# Patient Record
Sex: Female | Born: 1990 | Race: White | Hispanic: No | Marital: Married | Smoking: Never smoker
Health system: Southern US, Community
[De-identification: ages and names within clinical notes are randomized; demographics above are authoritative.]

## PROBLEM LIST (undated history)

## (undated) DIAGNOSIS — F988 Other specified behavioral and emotional disorders with onset usually occurring in childhood and adolescence: Secondary | ICD-10-CM

## (undated) HISTORY — DX: Other specified behavioral and emotional disorders with onset usually occurring in childhood and adolescence: F98.8

## (undated) HISTORY — PX: NO PAST SURGERIES: SHX2092

---

## 2017-12-02 ENCOUNTER — Encounter: Payer: Self-pay | Admitting: Obstetrics and Gynecology

## 2017-12-02 ENCOUNTER — Ambulatory Visit (INDEPENDENT_AMBULATORY_CARE_PROVIDER_SITE_OTHER): Payer: 59 | Admitting: Obstetrics and Gynecology

## 2017-12-02 VITALS — BP 112/65 | HR 77 | Ht 65.5 in | Wt 133.7 lb

## 2017-12-02 DIAGNOSIS — N926 Irregular menstruation, unspecified: Secondary | ICD-10-CM | POA: Diagnosis not present

## 2017-12-02 LAB — POCT URINE PREGNANCY: Preg Test, Ur: POSITIVE — AB

## 2017-12-02 NOTE — Progress Notes (Signed)
  Subjective:     Patient ID: Holly Tate, female   DOB: November 02, 1990, 27 y.o.   MRN: [id number]  HPI Here for pregnancy confirmation with LMP 09/28/17, giving [facility] 07/05/18 & EGA [redacted]w[redacted]d.  Review of Systems     Objective:   Physical Exam A&Ox4 Well groomed female Blood pressure 112/65, pulse 77, height 5' 5.5" (1.664 m), weight 133 lb 11.2 oz (60.6 kg), last menstrual period 09/28/2017. UPT+    Assessment:     Missed menses    Plan:     Viability scan and nurse intake in 1 week, then New OB PE in 3 weeks.   Jillana Selph,CNM

## 2017-12-02 NOTE — Patient Instructions (Signed)
First Trimester of Pregnancy The first trimester of pregnancy is from week 1 until the end of week 13 (months 1 through 3). During this time, your baby will begin to develop inside you. At 6-8 weeks, the eyes and face are formed, and the heartbeat can be seen on ultrasound. At the end of 12 weeks, all the baby's organs are formed. Prenatal care is all the medical care you receive before the birth of your baby. Make sure you get good prenatal care and follow all of your doctor's instructions. Follow these instructions at home: Medicines  Take over-the-counter and prescription medicines only as told by your doctor. Some medicines are safe and some medicines are not safe during pregnancy.  Take a prenatal vitamin that contains at least 600 micrograms (mcg) of folic acid.  If you have trouble pooping (constipation), take medicine that will make your stool soft (stool softener) if your doctor approves. Eating and drinking  Eat regular, healthy meals.  Your doctor will tell you the amount of weight gain that is right for you.  Avoid raw meat and uncooked cheese.  If you feel sick to your stomach (nauseous) or throw up (vomit): ? Eat 4 or 5 small meals a day instead of 3 large meals. ? Try eating a few soda crackers. ? Drink liquids between meals instead of during meals.  To prevent constipation: ? Eat foods that are high in fiber, like fresh fruits and vegetables, whole grains, and beans. ? Drink enough fluids to keep your pee (urine) clear or pale yellow. Activity  Exercise only as told by your doctor. Stop exercising if you have cramps or pain in your lower belly (abdomen) or low back.  Do not exercise if it is too hot, too humid, or if you are in a place of great height (high altitude).  Try to avoid standing for long periods of time. Move your legs often if you must stand in one place for a long time.  Avoid heavy lifting.  Wear low-heeled shoes. Sit and stand up straight.  You  can have sex unless your doctor tells you not to. Relieving pain and discomfort  Wear a good support bra if your breasts are sore.  Take warm water baths (sitz baths) to soothe pain or discomfort caused by hemorrhoids. Use hemorrhoid cream if your doctor says it is okay.  Rest with your legs raised if you have leg cramps or low back pain.  If you have puffy, bulging veins (varicose veins) in your legs: ? Wear support hose or compression stockings as told by your doctor. ? Raise (elevate) your feet for 15 minutes, 3-4 times a day. ? Limit salt in your food. Prenatal care  Schedule your prenatal visits by the twelfth week of pregnancy.  Write down your questions. Take them to your prenatal visits.  Keep all your prenatal visits as told by your doctor. This is important. Safety  Wear your seat belt at all times when driving.  Make a list of emergency phone numbers. The list should include numbers for family, friends, the hospital, and police and fire departments. General instructions  Ask your doctor for a referral to a local prenatal class. Begin classes no later than at the start of month 6 of your pregnancy.  Ask for help if you need counseling or if you need help with nutrition. Your doctor can give you advice or tell you where to go for help.  Do not use hot tubs, steam rooms, or   saunas.  Do not douche or use tampons or scented sanitary pads.  Do not cross your legs for long periods of time.  Avoid all herbs and alcohol. Avoid drugs that are not approved by your doctor.  Do not use any tobacco products, including cigarettes, chewing tobacco, and electronic cigarettes. If you need help quitting, ask your doctor. You may get counseling or other support to help you quit.  Avoid cat litter boxes and soil used by cats. These carry germs that can cause birth defects in the baby and can cause a loss of your baby (miscarriage) or stillbirth.  Visit your dentist. At home, brush  your teeth with a soft toothbrush. Be gentle when you floss. Contact a doctor if:  You are dizzy.  You have mild cramps or pressure in your lower belly.  You have a nagging pain in your belly area.  You continue to feel sick to your stomach, you throw up, or you have watery poop (diarrhea).  You have a bad smelling fluid coming from your vagina.  You have pain when you pee (urinate).  You have increased puffiness (swelling) in your face, hands, legs, or ankles. Get help right away if:  You have a fever.  You are leaking fluid from your vagina.  You have spotting or bleeding from your vagina.  You have very bad belly cramping or pain.  You gain or lose weight rapidly.  You throw up blood. It may look like coffee grounds.  You are around people who have German measles, fifth disease, or chickenpox.  You have a very bad headache.  You have shortness of breath.  You have any kind of trauma, such as from a fall or a car accident. Summary  The first trimester of pregnancy is from week 1 until the end of week 13 (months 1 through 3).  To take care of yourself and your unborn baby, you will need to eat healthy meals, take medicines only if your doctor tells you to do so, and do activities that are safe for you and your baby.  Keep all follow-up visits as told by your doctor. This is important as your doctor will have to ensure that your baby is healthy and growing well. This information is not intended to replace advice given to you by your health care provider. Make sure you discuss any questions you have with your health care provider. Document Released: 07/28/2007 Document Revised: 02/17/2016 Document Reviewed: 02/17/2016 [employer] Interactive Patient Education  2017 [employer]  

## 2017-12-07 ENCOUNTER — Ambulatory Visit (INDEPENDENT_AMBULATORY_CARE_PROVIDER_SITE_OTHER): Payer: 59

## 2017-12-07 DIAGNOSIS — O3411 Maternal care for benign tumor of corpus uteri, first trimester: Secondary | ICD-10-CM

## 2017-12-07 DIAGNOSIS — Z3A10 10 weeks gestation of pregnancy: Secondary | ICD-10-CM

## 2017-12-07 DIAGNOSIS — N8311 Corpus luteum cyst of right ovary: Secondary | ICD-10-CM | POA: Diagnosis not present

## 2017-12-07 DIAGNOSIS — N926 Irregular menstruation, unspecified: Secondary | ICD-10-CM

## 2017-12-09 ENCOUNTER — Ambulatory Visit (INDEPENDENT_AMBULATORY_CARE_PROVIDER_SITE_OTHER): Payer: 59 | Admitting: Obstetrics and Gynecology

## 2017-12-09 VITALS — BP 109/72 | HR 94 | Ht 65.5 in | Wt 133.5 lb

## 2017-12-09 DIAGNOSIS — Z202 Contact with and (suspected) exposure to infections with a predominantly sexual mode of transmission: Secondary | ICD-10-CM

## 2017-12-09 DIAGNOSIS — O219 Vomiting of pregnancy, unspecified: Secondary | ICD-10-CM

## 2017-12-09 DIAGNOSIS — Z3401 Encounter for supervision of normal first pregnancy, first trimester: Secondary | ICD-10-CM

## 2017-12-09 LAB — OB RESULTS CONSOLE VARICELLA ZOSTER ANTIBODY, IGG: Varicella: IMMUNE

## 2017-12-09 LAB — OB RESULTS CONSOLE GC/CHLAMYDIA: Gonorrhea: NEGATIVE

## 2017-12-09 NOTE — Progress Notes (Signed)
Holly Tate presents for NOB nurse interview visit. Pregnancy confirmation done _on 12/02/2016 with [facility].  G1-   P-0. LMP- 09/28/2017 EDD- 07/05/2018. Pt had aN u/s on 12/07/2017. Unable to view at this time. Pregnancy education material explained and given. __0_ cats in the home. NOB labs ordered. HIV labs and Drug screen were explained and ordered. PNV encouraged. Genetic screening options discussed. Genetic testing: Unsure.  Pt may discuss with provider.  Mild n/v. No meds needed at this time. Pt has never had a pap. Needs flu vaccine at 16 weeks. Needs tdap at 28 weeks. Encouraged my chart activation.  Financial policy reviewed. FMLA for reviewed and signed. Pt. To follow up with provider in _2_ weeks for NOB physical.  All questions answere

## 2017-12-09 NOTE — Patient Instructions (Signed)
WHAT OB PATIENTS CAN EXPECT   Confirmation of pregnancy and ultrasound ordered if medically indicated-[redacted] weeks gestation  New OB (NOB) intake with nurse and New OB (NOB) labs- [redacted] weeks gestation  New OB (NOB) physical examination with provider- 11/[redacted] weeks gestation  Flu vaccine-[redacted] weeks gestation  Anatomy scan-[redacted] weeks gestation  Glucose tolerance test, blood work to test for anemia, T-dap vaccine-[redacted] weeks gestation  Vaginal swabs/cultures-STD/Group B strep-[redacted] weeks gestation  Appointments every 4 weeks until 28 weeks  Every 2 weeks from 28 weeks until 36 weeks  Weekly visits from 36 weeks until delivery  Waterbirth Class  August 04, 2016  Wednesday 7:00p - 9:00p  [facility] [place], [place]  September 08, 2016  Wednesday 7:00p - 9:00p [facility] [place], [place]    October 13, 2016   Wednesday 7:00p - 9:000p [facility] [place], [place]  November 10, 2016  Wednesday  7:00p - 9:00p [facility] [place], [place]  December 08, 2016 Wednesday 7:00p - 9:00p [facility] [place], [place]  Interested in a waterbirth?  This informational class will help you discover whether waterbirth is the right fit for you.  Education about waterbirth itself, supplies you would need and how to assemble your support team is what you can expect from this class.  Some obstetrical practices require this class in order to pursue a waterbirth.  (Not all obstetrical practices offer waterbirth check with your healthcare provider)  Register only the expectant mom, but you are encouraged to bring your partner to class!  Fees & Payment No fee  Register Online [email or web address] Search Waterbirth Morning Sickness Morning sickness is when you feel sick to your stomach (nauseous) during pregnancy. You may feel sick to your stomach and throw up (vomit). You may feel sick in the morning, but you can  feel this way any time of day. Some women feel very sick to their stomach and cannot stop throwing up (hyperemesis gravidarum). Follow these instructions at home:  Only take medicines as told by your doctor.  Take multivitamins as told by your doctor. Taking multivitamins before getting pregnant can stop or lessen the harshness of morning sickness.  Eat dry toast or unsalted crackers before getting out of bed.  Eat 5 to 6 small meals a day.  Eat dry and bland foods like rice and baked potatoes.  Do not drink liquids with meals. Drink between meals.  Do not eat greasy, fatty, or spicy foods.  Have someone cook for you if the smell of food causes you to feel sick or throw up.  If you feel sick to your stomach after taking prenatal vitamins, take them at night or with a snack.  Eat protein when you need a snack (nuts, yogurt, cheese).  Eat unsweetened gelatins for dessert.  Wear a bracelet used for sea sickness (acupressure wristband).  Go to a doctor that puts thin needles into certain body points (acupuncture) to improve how you feel.  Do not smoke.  Use a humidifier to keep the air in your house free of odors.  Get lots of fresh air. Contact a doctor if:  You need medicine to feel better.  You feel dizzy or lightheaded.  You are losing weight. Get help right away if:  You feel very sick to your stomach and cannot stop throwing up.  You pass out (faint). This information is not intended to replace advice given to you by your health care provider. Make sure you   discuss any questions you have with your health care provider. Document Released: 03/18/2004 Document Revised: 07/17/2015 Document Reviewed: 07/26/2012 Elsevier Interactive Patient Education  2017 [employer] How a Baby Grows During Pregnancy Pregnancy begins when a female's sperm enters a female's egg (fertilization). This happens in one of the tubes (fallopian tubes) that connect the ovaries to the womb  (uterus). The fertilized egg is called an embryo until it reaches 10 weeks. From 10 weeks until birth, it is called a fetus. The fertilized egg moves down the fallopian tube to the uterus. Then it implants into the lining of the uterus and begins to grow. The developing fetus receives oxygen and nutrients through the pregnant woman's bloodstream and the tissues that grow (placenta) to support the fetus. The placenta is the life support system for the fetus. It provides nutrition and removes waste. Learning as much as you can about your pregnancy and how your baby is developing can help you enjoy the experience. It can also make you aware of when there might be a problem and when to ask questions. How long does a typical pregnancy last? A pregnancy usually lasts 280 days, or about 40 weeks. Pregnancy is divided into three trimesters:  First trimester: 0-13 weeks.  Second trimester: 14-27 weeks.  Third trimester: 28-40 weeks.  The day when your baby is considered ready to be born (full term) is your estimated date of delivery. How does my baby develop month by month? First month  The fertilized egg attaches to the inside of the uterus.  Some cells will form the placenta. Others will form the fetus.  The arms, legs, brain, spinal cord, lungs, and heart begin to develop.  At the end of the first month, the heart begins to beat.  Second month  The bones, inner ear, eyelids, hands, and feet form.  The genitals develop.  By the end of 8 weeks, all major organs are developing.  Third month  All of the internal organs are forming.  Teeth develop below the gums.  Bones and muscles begin to grow. The spine can flex.  The skin is transparent.  Fingernails and toenails begin to form.  Arms and legs continue to grow longer, and hands and feet develop.  The fetus is about 3 in (7.6 cm) long.  Fourth month  The placenta is completely formed.  The external sex organs, neck, outer  ear, eyebrows, eyelids, and fingernails are formed.  The fetus can hear, swallow, and move its arms and legs.  The kidneys begin to produce urine.  The skin is covered with a white waxy coating (vernix) and very fine hair (lanugo).  Fifth month  The fetus moves around more and can be felt for the first time (quickening).  The fetus starts to sleep and wake up and may begin to suck its finger.  The nails grow to the end of the fingers.  The organ in the digestive system that makes bile (gallbladder) functions and helps to digest the nutrients.  If your baby is a girl, eggs are present in her ovaries. If your baby is a boy, testicles start to move down into his scrotum.  Sixth month  The lungs are formed, but the fetus is not yet able to breathe.  The eyes open. The brain continues to develop.  Your baby has fingerprints and toe prints. Your baby's hair grows thicker.  At the end of the second trimester, the fetus is about 9 in (22.9 cm) long.  Seventh   month  The fetus kicks and stretches.  The eyes are developed enough to sense changes in light.  The hands can make a grasping motion.  The fetus responds to sound.  Eighth month  All organs and body systems are fully developed and functioning.  Bones harden and taste buds develop. The fetus may hiccup.  Certain areas of the brain are still developing. The skull remains soft.  Ninth month  The fetus gains about  lb (0.23 kg) each week.  The lungs are fully developed.  Patterns of sleep develop.  The fetus's head typically moves into a head-down position (vertex) in the uterus to prepare for birth. If the buttocks move into a vertex position instead, the baby is breech.  The fetus weighs 6-9 lbs (2.72-4.08 kg) and is 19-20 in (48.26-50.8 cm) long.  What can I do to have a healthy pregnancy and help my baby develop? Eating and Drinking  Eat a healthy diet. ? Talk with your health care provider to make sure  that you are getting the nutrients that you and your baby need. ? Visit www.choosemyplate.gov to learn about creating a healthy diet.  Gain a healthy amount of weight during pregnancy as advised by your health care provider. This is usually 25-35 pounds. You may need to: ? Gain more if you were underweight before getting pregnant or if you are pregnant with more than one baby. ? Gain less if you were overweight or obese when you got pregnant.  Medicines and Vitamins  Take prenatal vitamins as directed by your health care provider. These include vitamins such as folic acid, iron, calcium, and vitamin D. They are important for healthy development.  Take medicines only as directed by your health care provider. Read labels and ask a pharmacist or your health care provider whether over-the-counter medicines, supplements, and prescription drugs are safe to take during pregnancy.  Activities  Be physically active as advised by your health care provider. Ask your health care provider to recommend activities that are safe for you to do, such as walking or swimming.  Do not participate in strenuous or extreme sports.  Lifestyle  Do not drink alcohol.  Do not use any tobacco products, including cigarettes, chewing tobacco, or electronic cigarettes. If you need help quitting, ask your health care provider.  Do not use illegal drugs.  Safety  Avoid exposure to mercury, lead, or other heavy metals. Ask your health care provider about common sources of these heavy metals.  Avoid listeria infection during pregnancy. Follow these precautions: ? Do not eat soft cheeses or deli meats. ? Do not eat hot dogs unless they have been warmed up to the point of steaming, such as in the microwave oven. ? Do not drink unpasteurized milk.  Avoid toxoplasmosis infection during pregnancy. Follow these precautions: ? Do not change your cat's litter box, if you have a cat. Ask someone else to do this for  you. ? Wear gardening gloves while working in the yard.  General Instructions  Keep all follow-up visits as directed by your health care provider. This is important. This includes prenatal care and screening tests.  Manage any chronic health conditions. Work closely with your health care provider to keep conditions, such as diabetes, under control.  How do I know if my baby is developing well? At each prenatal visit, your health care provider will do several different tests to check on your health and keep track of your baby's development. These include:  Fundal height. ?   Your health care provider will measure your growing belly from top to bottom using a tape measure. ? Your health care provider will also feel your belly to determine your baby's position.  Heartbeat. ? An ultrasound in the first trimester can confirm pregnancy and show a heartbeat, depending on how far along you are. ? Your health care provider will check your baby's heart rate at every prenatal visit. ? As you get closer to your delivery date, you may have regular fetal heart rate monitoring to make sure that your baby is not in distress.  Second trimester ultrasound. ? This ultrasound checks your baby's development. It also indicates your baby's gender.  What should I do if I have concerns about my baby's development? Always talk with your health care provider about any concerns that you may have. This information is not intended to replace advice given to you by your health care provider. Make sure you discuss any questions you have with your health care provider. Document Released: 07/28/2007 Document Revised: 07/17/2015 Document Reviewed: 07/18/2013 Elsevier Interactive Patient Education  2018 [employer]. First Trimester of Pregnancy The first trimester of pregnancy is from week 1 until the end of week 13 (months 1 through 3). A week after a sperm fertilizes an egg, the egg will implant on the wall of the  uterus. This embryo will begin to develop into a baby. Genes from you and your partner will form the baby. The female genes will determine whether the baby will be a boy or a girl. At 6-8 weeks, the eyes and face will be formed, and the heartbeat can be seen on ultrasound. At the end of 12 weeks, all the baby's organs will be formed. Now that you are pregnant, you will want to do everything you can to have a healthy baby. Two of the most important things are to get good prenatal care and to follow your health care provider's instructions. Prenatal care is all the medical care you receive before the baby's birth. This care will help prevent, find, and treat any problems during the pregnancy and childbirth. Body changes during your first trimester Your body goes through many changes during pregnancy. The changes vary from woman to woman.  You may gain or lose a couple of pounds at first.  You may feel sick to your stomach (nauseous) and you may throw up (vomit). If the vomiting is uncontrollable, call your health care provider.  You may tire easily.  You may develop headaches that can be relieved by medicines. All medicines should be approved by your health care provider.  You may urinate more often. Painful urination may mean you have a bladder infection.  You may develop heartburn as a result of your pregnancy.  You may develop constipation because certain hormones are causing the muscles that push stool through your intestines to slow down.  You may develop hemorrhoids or swollen veins (varicose veins).  Your breasts may begin to grow larger and become tender. Your nipples may stick out more, and the tissue that surrounds them (areola) may become darker.  Your gums may bleed and may be sensitive to brushing and flossing.  Dark spots or blotches (chloasma, mask of pregnancy) may develop on your face. This will likely fade after the baby is born.  Your menstrual periods will stop.  You may  have a loss of appetite.  You may develop cravings for certain kinds of food.  You may have changes in your emotions from day to   day, such as being excited to be pregnant or being concerned that something may go wrong with the pregnancy and baby.  You may have more vivid and strange dreams.  You may have changes in your hair. These can include thickening of your hair, rapid growth, and changes in texture. Some women also have hair loss during or after pregnancy, or hair that feels dry or thin. Your hair will most likely return to normal after your baby is born.  What to expect at prenatal visits During a routine prenatal visit:  You will be weighed to make sure you and the baby are growing normally.  Your blood pressure will be taken.  Your abdomen will be measured to track your baby's growth.  The fetal heartbeat will be listened to between weeks 10 and 14 of your pregnancy.  Test results from any previous visits will be discussed.  Your health care provider may ask you:  How you are feeling.  If you are feeling the baby move.  If you have had any abnormal symptoms, such as leaking fluid, bleeding, severe headaches, or abdominal cramping.  If you are using any tobacco products, including cigarettes, chewing tobacco, and electronic cigarettes.  If you have any questions.  Other tests that may be performed during your first trimester include:  Blood tests to find your blood type and to check for the presence of any previous infections. The tests will also be used to check for low iron levels (anemia) and protein on red blood cells (Rh antibodies). Depending on your risk factors, or if you previously had diabetes during pregnancy, you may have tests to check for high blood sugar that affects pregnant women (gestational diabetes).  Urine tests to check for infections, diabetes, or protein in the urine.  An ultrasound to confirm the proper growth and development of the  baby.  Fetal screens for spinal cord problems (spina bifida) and Down syndrome.  HIV (human immunodeficiency virus) testing. Routine prenatal testing includes screening for HIV, unless you choose not to have this test.  You may need other tests to make sure you and the baby are doing well.  Follow these instructions at home: Medicines  Follow your health care provider's instructions regarding medicine use. Specific medicines may be either safe or unsafe to take during pregnancy.  Take a prenatal vitamin that contains at least 600 micrograms (mcg) of folic acid.  If you develop constipation, try taking a stool softener if your health care provider approves. Eating and drinking  Eat a balanced diet that includes fresh fruits and vegetables, whole grains, good sources of protein such as meat, eggs, or tofu, and low-fat dairy. Your health care provider will help you determine the amount of weight gain that is right for you.  Avoid raw meat and uncooked cheese. These carry germs that can cause birth defects in the baby.  Eating four or five small meals rather than three large meals a day may help relieve nausea and vomiting. If you start to feel nauseous, eating a few soda crackers can be helpful. Drinking liquids between meals, instead of during meals, also seems to help ease nausea and vomiting.  Limit foods that are high in fat and processed sugars, such as fried and sweet foods.  To prevent constipation: ? Eat foods that are high in fiber, such as fresh fruits and vegetables, whole grains, and beans. ? Drink enough fluid to keep your urine clear or pale yellow. Activity  Exercise only as directed   by your health care provider. Most women can continue their usual exercise routine during pregnancy. Try to exercise for 30 minutes at least 5 days a week. Exercising will help you: ? Control your weight. ? Stay in shape. ? Be prepared for labor and delivery.  Experiencing pain or cramping  in the lower abdomen or lower back is a good sign that you should stop exercising. Check with your health care provider before continuing with normal exercises.  Try to avoid standing for long periods of time. Move your legs often if you must stand in one place for a long time.  Avoid heavy lifting.  Wear low-heeled shoes and practice good posture.  You may continue to have sex unless your health care provider tells you not to. Relieving pain and discomfort  Wear a good support bra to relieve breast tenderness.  Take warm sitz baths to soothe any pain or discomfort caused by hemorrhoids. Use hemorrhoid cream if your health care provider approves.  Rest with your legs elevated if you have leg cramps or low back pain.  If you develop varicose veins in your legs, wear support hose. Elevate your feet for 15 minutes, 3-4 times a day. Limit salt in your diet. Prenatal care  Schedule your prenatal visits by the twelfth week of pregnancy. They are usually scheduled monthly at first, then more often in the last 2 months before delivery.  Write down your questions. Take them to your prenatal visits.  Keep all your prenatal visits as told by your health care provider. This is important. Safety  Wear your seat belt at all times when driving.  Make a list of emergency phone numbers, including numbers for family, friends, the hospital, and police and fire departments. General instructions  Ask your health care provider for a referral to a local prenatal education class. Begin classes no later than the beginning of month 6 of your pregnancy.  Ask for help if you have counseling or nutritional needs during pregnancy. Your health care provider can offer advice or refer you to specialists for help with various needs.  Do not use hot tubs, steam rooms, or saunas.  Do not douche or use tampons or scented sanitary pads.  Do not cross your legs for long periods of time.  Avoid cat litter boxes  and soil used by cats. These carry germs that can cause birth defects in the baby and possibly loss of the fetus by miscarriage or stillbirth.  Avoid all smoking, herbs, alcohol, and medicines not prescribed by your health care provider. Chemicals in these products affect the formation and growth of the baby.  Do not use any products that contain nicotine or tobacco, such as cigarettes and e-cigarettes. If you need help quitting, ask your health care provider. You may receive counseling support and other resources to help you quit.  Schedule a dentist appointment. At home, brush your teeth with a soft toothbrush and be gentle when you floss. Contact a health care provider if:  You have dizziness.  You have mild pelvic cramps, pelvic pressure, or nagging pain in the abdominal area.  You have persistent nausea, vomiting, or diarrhea.  You have a bad smelling vaginal discharge.  You have pain when you urinate.  You notice increased swelling in your face, hands, legs, or ankles.  You are exposed to fifth disease or chickenpox.  You are exposed to German measles (rubella) and have never had it. Get help right away if:  You have a fever.    You are leaking fluid from your vagina.  You have spotting or bleeding from your vagina.  You have severe abdominal cramping or pain.  You have rapid weight gain or loss.  You vomit blood or material that looks like coffee grounds.  You develop a severe headache.  You have shortness of breath.  You have any kind of trauma, such as from a fall or a car accident. Summary  The first trimester of pregnancy is from week 1 until the end of week 13 (months 1 through 3).  Your body goes through many changes during pregnancy. The changes vary from woman to woman.  You will have routine prenatal visits. During those visits, your health care provider will examine you, discuss any test results you may have, and talk with you about how you are  feeling. This information is not intended to replace advice given to you by your health care provider. Make sure you discuss any questions you have with your health care provider. Document Released: 02/02/2001 Document Revised: 01/21/2016 Document Reviewed: 01/21/2016 Elsevier Interactive Patient Education  2018 [employer]. Commonly Asked Questions During Pregnancy  Cats: A parasite can be excreted in cat feces.  To avoid exposure you need to have another person empty the little box.  If you must empty the litter box you will need to wear gloves.  Wash your hands after handling your cat.  This parasite can also be found in raw or undercooked meat so this should also be avoided.  Colds, Sore Throats, Flu: Please check your medication sheet to see what you can take for symptoms.  If your symptoms are unrelieved by these medications please call the office.  Dental Work: Most any dental work your dentist recommends is permitted.  X-rays should only be taken during the first trimester if absolutely necessary.  Your abdomen should be shielded with a lead apron during all x-rays.  Please notify your provider prior to receiving any x-rays.  Novocaine is fine; gas is not recommended.  If your dentist requires a note from us prior to dental work please call the office and we will provide one for you.  Exercise: Exercise is an important part of staying healthy during your pregnancy.  You may continue most exercises you were accustomed to prior to pregnancy.  Later in your pregnancy you will most likely notice you have difficulty with activities requiring balance like riding a bicycle.  It is important that you listen to your body and avoid activities that put you at a higher risk of falling.  Adequate rest and staying well hydrated are a must!  If you have questions about the safety of specific activities ask your provider.    Exposure to Children with illness: Try to avoid obvious exposure; report any  symptoms to us when noted,  If you have chicken pos, red measles or mumps, you should be immune to these diseases.   Please do not take any vaccines while pregnant unless you have checked with your OB provider.  Fetal Movement: After 28 weeks we recommend you do "kick counts" twice daily.  Lie or sit down in a calm quiet environment and count your baby movements "kicks".  You should feel your baby at least 10 times per hour.  If you have not felt 10 kicks within the first hour get up, walk around and have something sweet to eat or drink then repeat for an additional hour.  If count remains less than 10 per hour notify your   provider.  Fumigating: Follow your pest control agent's advice as to how long to stay out of your home.  Ventilate the area well before re-entering.  Hemorrhoids:   Most over-the-counter preparations can be used during pregnancy.  Check your medication to see what is safe to use.  It is important to use a stool softener or fiber in your diet and to drink lots of liquids.  If hemorrhoids seem to be getting worse please call the office.   Hot Tubs:  Hot tubs Jacuzzis and saunas are not recommended while pregnant.  These increase your internal body temperature and should be avoided.  Intercourse:  Sexual intercourse is safe during pregnancy as long as you are comfortable, unless otherwise advised by your provider.  Spotting may occur after intercourse; report any bright red bleeding that is heavier than spotting.  Labor:  If you know that you are in labor, please go to the hospital.  If you are unsure, please call the office and let us help you decide what to do.  Lifting, straining, etc:  If your job requires heavy lifting or straining please check with your provider for any limitations.  Generally, you should not lift items heavier than that you can lift simply with your hands and arms (no back muscles)  Painting:  Paint fumes do not harm your pregnancy, but may make you ill and  should be avoided if possible.  Latex or water based paints have less odor than oils.  Use adequate ventilation while painting.  Permanents & Hair Color:  Chemicals in hair dyes are not recommended as they cause increase hair dryness which can increase hair loss during pregnancy.  " Highlighting" and permanents are allowed.  Dye may be absorbed differently and permanents may not hold as well during pregnancy.  Sunbathing:  Use a sunscreen, as skin burns easily during pregnancy.  Drink plenty of fluids; avoid over heating.  Tanning Beds:  Because their possible side effects are still unknown, tanning beds are not recommended.  Ultrasound Scans:  Routine ultrasounds are performed at approximately 20 weeks.  You will be able to see your baby's general anatomy an if you would like to know the gender this can usually be determined as well.  If it is questionable when you conceived you may also receive an ultrasound early in your pregnancy for dating purposes.  Otherwise ultrasound exams are not routinely performed unless there is a medical necessity.  Although you can request a scan we ask that you pay for it when conducted because insurance does not cover " patient request" scans.  Work: If your pregnancy proceeds without complications you may work until your due date, unless your physician or employer advises otherwise.  Round Ligament Pain/Pelvic Discomfort:  Sharp, shooting pains not associated with bleeding are fairly common, usually occurring in the second trimester of pregnancy.  They tend to be worse when standing up or when you remain standing for long periods of time.  These are the result of pressure of certain pelvic ligaments called "round ligaments".  Rest, Tylenol and heat seem to be the most effective relief.  As the womb and fetus grow, they rise out of the pelvis and the discomfort improves.  Please notify the office if your pain seems different than that described.  It may represent a more  serious condition.  Common Medications Safe in Pregnancy  Acne:      Constipation:  Benzoyl Peroxide     Colace  Clindamycin        Dulcolax Suppository  Topica Erythromycin     Fibercon  Salicylic Acid      Metamucil         Miralax AVOID:        Senakot   Accutane    Cough:  Retin-A       Cough Drops  Tetracycline      Phenergan w/ Codeine if Rx  Minocycline      Robitussin (Plain & DM)  Antibiotics:     Crabs/Lice:  Ceclor       RID  Cephalosporins    AVOID:  E-Mycins      Kwell  Keflex  Macrobid/Macrodantin   Diarrhea:  Penicillin      Kao-Pectate  Zithromax      Imodium AD         PUSH FLUIDS AVOID:       Cipro     Fever:  Tetracycline      Tylenol (Regular or Extra  Minocycline       Strength)  Levaquin      Extra Strength-Do not          Exceed 8 tabs/24 hrs Caffeine:        <200mg/day (equiv. To 1 cup of coffee or  approx. 3 12 oz sodas)         Gas: Cold/Hayfever:       Gas-X  Benadryl      Mylicon  Claritin       Phazyme  **Claritin-D        Chlor-Trimeton    Headaches:  Dimetapp      ASA-Free Excedrin  Drixoral-Non-Drowsy     Cold Compress  Mucinex (Guaifenasin)     Tylenol (Regular or Extra  Sudafed/Sudafed-12 Hour     Strength)  **Sudafed PE Pseudoephedrine   Tylenol Cold & Sinus     Vicks Vapor Rub  Zyrtec  **AVOID if Problems With Blood Pressure         Heartburn: Avoid lying down for at least 1 hour after meals  Aciphex      Maalox     Rash:  Milk of Magnesia     Benadryl    Mylanta       1% Hydrocortisone Cream  Pepcid  Pepcid Complete   Sleep Aids:  Prevacid      Ambien   Prilosec       Benadryl  Rolaids       Chamomile Tea  Tums (Limit 4/day)     Unisom  Zantac       Tylenol PM         Warm milk-add vanilla or  Hemorrhoids:       Sugar for taste  Anusol/Anusol H.C.  (RX: Analapram 2.5%)  Sugar Substitutes:  Hydrocortisone OTC     Ok in moderation  Preparation H      Tucks        Vaseline lotion applied to tissue with  wiping    Herpes:     Throat:  Acyclovir      Oragel  Famvir  Valtrex     Vaccines:         Flu Shot Leg Cramps:       *Gardasil  Benadryl      Hepatitis A         Hepatitis B Nasal Spray:       Pneumovax  Saline Nasal Spray     Polio Booster         Tetanus Nausea:         Tuberculosis test or PPD  Vitamin B6 25 mg TID   AVOID:    Dramamine      *Gardasil  Emetrol       Live Poliovirus  Ginger Root 250 mg QID    MMR (measles, mumps &  High Complex Carbs @ Bedtime    rebella)  Sea Bands-Accupressure    Varicella (Chickenpox)  Unisom 1/2 tab TID     *No known complications           If received before Pain:         Known pregnancy;   Darvocet       Resume series after  Lortab        Delivery  Percocet    Yeast:   Tramadol      Femstat  Tylenol 3      Gyne-lotrimin  Ultram       Monistat  Vicodin           MISC:         All Sunscreens           Hair Coloring/highlights          Insect Repellant's          (Including DEET)         [place] [facility] List   [facility]  [address], [place], [place] [address]  Phone: [phone]   [facility] (second location)  [address] [place], [place] [address]  Phone: [phone]   [facility] ([place]) [address], [place], [place] [address] Phone: [phone]   [facility]  [address] [place], [place] [address]  Phone: [phone] 

## 2017-12-10 LAB — CBC WITH DIFFERENTIAL/PLATELET
BASOS: 1 %
Basophils Absolute: 0 10*3/uL (ref 0.0–0.2)
EOS (ABSOLUTE): 0.1 10*3/uL (ref 0.0–0.4)
EOS: 1 %
HEMATOCRIT: 39.4 % (ref 34.0–46.6)
Hemoglobin: 13.7 g/dL (ref 11.1–15.9)
Immature Grans (Abs): 0 10*3/uL (ref 0.0–0.1)
Immature Granulocytes: 0 %
LYMPHS ABS: 1.8 10*3/uL (ref 0.7–3.1)
Lymphs: 23 %
MCH: 33.4 pg — AB (ref 26.6–33.0)
MCHC: 34.8 g/dL (ref 31.5–35.7)
MCV: 96 fL (ref 79–97)
MONOS ABS: 0.6 10*3/uL (ref 0.1–0.9)
Monocytes: 7 %
NEUTROS ABS: 5.4 10*3/uL (ref 1.4–7.0)
Neutrophils: 68 %
PLATELETS: 224 10*3/uL (ref 150–450)
RBC: 4.1 x10E6/uL (ref 3.77–5.28)
RDW: 11.4 % — AB (ref 12.3–15.4)
WBC: 7.9 10*3/uL (ref 3.4–10.8)

## 2017-12-10 LAB — HIV ANTIBODY (ROUTINE TESTING W REFLEX): HIV Screen 4th Generation wRfx: NONREACTIVE

## 2017-12-10 LAB — URINALYSIS, ROUTINE W REFLEX MICROSCOPIC
BILIRUBIN UA: NEGATIVE
Glucose, UA: NEGATIVE
Ketones, UA: NEGATIVE
Leukocytes, UA: NEGATIVE
Nitrite, UA: NEGATIVE
PH UA: 6 (ref 5.0–7.5)
PROTEIN UA: NEGATIVE
RBC UA: NEGATIVE
Specific Gravity, UA: 1.019 (ref 1.005–1.030)
Urobilinogen, Ur: 0.2 mg/dL (ref 0.2–1.0)

## 2017-12-10 LAB — HEPATITIS B SURFACE ANTIGEN: Hepatitis B Surface Ag: NEGATIVE

## 2017-12-10 LAB — ABO AND RH: RH TYPE: POSITIVE

## 2017-12-10 LAB — VARICELLA ZOSTER ANTIBODY, IGG: Varicella zoster IgG: 2633 index (ref >165)

## 2017-12-10 LAB — ANTIBODY SCREEN: Antibody Screen: NEGATIVE

## 2017-12-10 LAB — RUBELLA SCREEN: Rubella Antibodies, IGG: 3.4 index (ref >0.99)

## 2017-12-10 LAB — RPR: RPR Ser Ql: NONREACTIVE

## 2017-12-11 LAB — CULTURE, OB URINE

## 2017-12-11 LAB — GC/CHLAMYDIA PROBE AMP
Chlamydia trachomatis, NAA: NEGATIVE
Neisseria gonorrhoeae by PCR: NEGATIVE

## 2017-12-11 LAB — URINE CULTURE, OB REFLEX

## 2017-12-12 LAB — MONITOR DRUG PROFILE 14(MW)
AMPHETAMINE SCREEN URINE: NEGATIVE ng/mL
BARBITURATE SCREEN URINE: NEGATIVE ng/mL
BENZODIAZEPINE SCREEN, URINE: NEGATIVE ng/mL
Buprenorphine, Urine: NEGATIVE ng/mL
CANNABINOIDS UR QL SCN: NEGATIVE ng/mL
Cocaine (Metab) Scrn, Ur: NEGATIVE ng/mL
Creatinine(Crt), U: 132.4 mg/dL (ref 20.0–300.0)
FENTANYL, URINE: NEGATIVE pg/mL
Meperidine Screen, Urine: NEGATIVE ng/mL
Methadone Screen, Urine: NEGATIVE ng/mL
OXYCODONE+OXYMORPHONE UR QL SCN: NEGATIVE ng/mL
Opiate Scrn, Ur: NEGATIVE ng/mL
PH UR, DRUG SCRN: 6.1 (ref 4.5–8.9)
PHENCYCLIDINE QUANTITATIVE URINE: NEGATIVE ng/mL
Propoxyphene Scrn, Ur: NEGATIVE ng/mL
SPECIFIC GRAVITY: 1.021
Tramadol Screen, Urine: NEGATIVE ng/mL

## 2017-12-12 LAB — NICOTINE SCREEN, URINE: COTININE UR QL SCN: NEGATIVE ng/mL

## 2017-12-13 ENCOUNTER — Other Ambulatory Visit: Payer: Self-pay | Admitting: Obstetrics and Gynecology

## 2017-12-13 MED ORDER — FLUCONAZOLE 100 MG PO TABS: 100.0000 mg | ORAL_TABLET | Freq: Every day | ORAL | 1 refills | Status: DC

## 2017-12-27 ENCOUNTER — Encounter: Payer: Self-pay | Admitting: Obstetrics and Gynecology

## 2017-12-27 ENCOUNTER — Ambulatory Visit (INDEPENDENT_AMBULATORY_CARE_PROVIDER_SITE_OTHER): Payer: 59 | Admitting: Obstetrics and Gynecology

## 2017-12-27 VITALS — BP 114/78 | HR 98 | Wt 136.3 lb

## 2017-12-27 DIAGNOSIS — Z3492 Encounter for supervision of normal pregnancy, unspecified, second trimester: Secondary | ICD-10-CM | POA: Diagnosis not present

## 2017-12-27 DIAGNOSIS — Z3A12 12 weeks gestation of pregnancy: Secondary | ICD-10-CM

## 2017-12-27 DIAGNOSIS — Z23 Encounter for immunization: Secondary | ICD-10-CM | POA: Diagnosis not present

## 2017-12-27 DIAGNOSIS — Z3401 Encounter for supervision of normal first pregnancy, first trimester: Secondary | ICD-10-CM

## 2017-12-27 LAB — POCT URINALYSIS DIPSTICK OB
BILIRUBIN UA: NEGATIVE
Glucose, UA: NEGATIVE
KETONES UA: NEGATIVE
LEUKOCYTES UA: NEGATIVE
Nitrite, UA: NEGATIVE
POC,PROTEIN,UA: NEGATIVE
RBC UA: NEGATIVE
SPEC GRAV UA: 1.01 (ref 1.010–1.025)
Urobilinogen, UA: 0.2 E.U./dL
pH, UA: 6 (ref 5.0–8.0)

## 2017-12-27 NOTE — Progress Notes (Signed)
NOB PE- pt is doing well, denies genetic testing 

## 2017-12-27 NOTE — Progress Notes (Signed)
NEW OB HISTORY AND PHYSICAL  SUBJECTIVE:       Holly Tate is a 27 y.o. G1P0 female, Patient's last menstrual period was 09/28/2017., Estimated Date of Delivery: 07/05/18, [redacted]w[redacted]d, presents today for establishment of Prenatal Care. She has no unusual complaints.      Gynecologic History Patient's last menstrual period was 09/28/2017. Normal Contraception: none Last Pap: ?Marland Kitchen Results were: normal  Obstetric History OB History  Gravida Para Term Preterm AB Living  1            SAB TAB Ectopic Multiple Live Births               # Outcome Date GA Lbr Len/2nd Weight Sex Delivery Anes PTL Lv  1 Current             Past Medical History:  Diagnosis Date  . ADD (attention deficit disorder)     Past Surgical History:  Procedure Laterality Date  . NO PAST SURGERIES      Current Outpatient Medications on File Prior to Visit  Medication Sig Dispense Refill  . Prenatal Vit-Fe Fumarate-FA (PRENATAL MULTIVITAMIN) TABS tablet Take 1 tablet by mouth daily at 12 noon.    . fluconazole (DIFLUCAN) 100 MG tablet Take 1 tablet (100 mg total) by mouth daily. (Patient not taking: Reported on 12/27/2017) 7 tablet 1   No current facility-administered medications on file prior to visit.     Allergies  Allergen Reactions  . Omnicef [Cefdinir]   . Penicillins     Social History   Socioeconomic History  . Marital status: Married    Spouse name: Not on file  . Number of children: Not on file  . Years of education: Not on file  . Highest education level: Not on file  Occupational History  . Not on file  Social Needs  . Financial resource strain: Not on file  . Food insecurity:    Worry: Not on file    Inability: Not on file  . Transportation needs:    Medical: Not on file    Non-medical: Not on file  Tobacco Use  . Smoking status: Never Smoker  . Smokeless tobacco: Never Used  Substance and Sexual Activity  . Alcohol use: Not Currently  . Drug use: Never  . Sexual activity: Yes     Birth control/protection: None  Lifestyle  . Physical activity:    Days per week: Not on file    Minutes per session: Not on file  . Stress: Not on file  Relationships  . Social connections:    Talks on phone: Not on file    Gets together: Not on file    Attends religious service: Not on file    Active member of club or organization: Not on file    Attends meetings of clubs or organizations: Not on file    Relationship status: Not on file  . Intimate partner violence:    Fear of current or ex partner: Not on file    Emotionally abused: Not on file    Physically abused: Not on file    Forced sexual activity: Not on file  Other Topics Concern  . Not on file  Social History Narrative  . Not on file    Family History  Problem Relation Age of Onset  . Breast cancer Neg Hx   . Ovarian cancer Neg Hx   . Colon cancer Neg Hx     The following portions of the patient's history were reviewed  and updated as appropriate: allergies, current medications, past OB history, past medical history, past surgical history, past family history, past social history, and problem list.    OBJECTIVE: Initial Physical Exam (New OB)  GENERAL APPEARANCE: alert, well appearing, in no apparent distress, oriented to person, place and time HEAD: normocephalic, atraumatic MOUTH: mucous membranes moist, pharynx normal without lesions and dental hygiene good THYROID: no thyromegaly or masses present BREASTS: not examined LUNGS: clear to auscultation, no wheezes, rales or rhonchi, symmetric air entry HEART: regular rate and rhythm, no murmurs ABDOMEN: soft, nontender, nondistended, no abnormal masses, no epigastric pain, fundus not palpable and FHT present EXTREMITIES: no redness or tenderness in the calves or thighs SKIN: normal coloration and turgor, no rashes LYMPH NODES: no adenopathy palpable NEUROLOGIC: alert, oriented, normal speech, no focal findings or movement disorder noted  PELVIC EXAM  EXTERNAL GENITALIA: normal appearing vulva with no masses, tenderness or lesions VAGINA: no abnormal discharge or lesions CERVIX: no lesions or cervical motion tenderness UTERUS: gravid ADNEXA: no masses palpable and nontender  ASSESSMENT: Normal pregnancy  PLAN: Prenatal care See orders

## 2017-12-27 NOTE — Addendum Note (Signed)
Addended by: Rosine Beat L on: 12/27/2017 10:42 AM   Modules accepted: Orders

## 2017-12-27 NOTE — Patient Instructions (Signed)
Second Trimester of Pregnancy The second trimester is from week 13 through week 28, month 4 through 6. This is often the time in pregnancy that you feel your best. Often times, morning sickness has lessened or quit. You may have more energy, and you may get hungry more often. Your unborn baby (fetus) is growing rapidly. At the end of the sixth month, he or she is about 9 inches long and weighs about 1 pounds. You will likely feel the baby move (quickening) between 18 and 20 weeks of pregnancy. Follow these instructions at home:  Avoid all smoking, herbs, and alcohol. Avoid drugs not approved by your doctor.  Do not use any tobacco products, including cigarettes, chewing tobacco, and electronic cigarettes. If you need help quitting, ask your doctor. You may get counseling or other support to help you quit.  Only take medicine as told by your doctor. Some medicines are safe and some are not during pregnancy.  Exercise only as told by your doctor. Stop exercising if you start having cramps.  Eat regular, healthy meals.  Wear a good support bra if your breasts are tender.  Do not use hot tubs, steam rooms, or saunas.  Wear your seat belt when driving.  Avoid raw meat, uncooked cheese, and liter boxes and soil used by cats.  Take your prenatal vitamins.  Take 1500-2000 milligrams of calcium daily starting at the 20th week of pregnancy until you deliver your baby.  Try taking medicine that helps you poop (stool softener) as needed, and if your doctor approves. Eat more fiber by eating fresh fruit, vegetables, and whole grains. Drink enough fluids to keep your pee (urine) clear or pale yellow.  Take warm water baths (sitz baths) to soothe pain or discomfort caused by hemorrhoids. Use hemorrhoid cream if your doctor approves.  If you have puffy, bulging veins (varicose veins), wear support hose. Raise (elevate) your feet for 15 minutes, 3-4 times a day. Limit salt in your diet.  Avoid heavy  lifting, wear low heals, and sit up straight.  Rest with your legs raised if you have leg cramps or low back pain.  Visit your dentist if you have not gone during your pregnancy. Use a soft toothbrush to brush your teeth. Be gentle when you floss.  You can have sex (intercourse) unless your doctor tells you not to.  Go to your doctor visits. Get help if:  You feel dizzy.  You have mild cramps or pressure in your lower belly (abdomen).  You have a nagging pain in your belly area.  You continue to feel sick to your stomach (nauseous), throw up (vomit), or have watery poop (diarrhea).  You have bad smelling fluid coming from your vagina.  You have pain with peeing (urination). Get help right away if:  You have a fever.  You are leaking fluid from your vagina.  You have spotting or bleeding from your vagina.  You have severe belly cramping or pain.  You lose or gain weight rapidly.  You have trouble catching your breath and have chest pain.  You notice sudden or extreme puffiness (swelling) of your face, hands, ankles, feet, or legs.  You have not felt the baby move in over an hour.  You have severe headaches that do not go away with medicine.  You have vision changes. This information is not intended to replace advice given to you by your health care provider. Make sure you discuss any questions you have with your health care   provider. Document Released: 05/05/2009 Document Revised: 07/17/2015 Document Reviewed: 04/11/2012 Elsevier Interactive Patient Education  2017 [employer]  

## 2017-12-27 NOTE — Addendum Note (Signed)
Addended by: Rosine Beat L on: 12/27/2017 09:38 AM   Modules accepted: Orders

## 2017-12-29 LAB — CYTOLOGY - PAP
Adequacy: ABSENT
Diagnosis: NEGATIVE

## 2018-01-24 ENCOUNTER — Ambulatory Visit (INDEPENDENT_AMBULATORY_CARE_PROVIDER_SITE_OTHER): Payer: 59 | Admitting: Certified Nurse Midwife

## 2018-01-24 VITALS — BP 113/67 | HR 75 | Wt 138.9 lb

## 2018-01-24 DIAGNOSIS — Z3402 Encounter for supervision of normal first pregnancy, second trimester: Secondary | ICD-10-CM

## 2018-01-24 DIAGNOSIS — Z3A16 16 weeks gestation of pregnancy: Secondary | ICD-10-CM

## 2018-01-24 NOTE — Progress Notes (Signed)
ROB, c/o feeling more fatigued.

## 2018-01-24 NOTE — Progress Notes (Signed)
ROB-Feeling better. Reports intermittent fatigue and reflux. Discussed home treatment measures. Declines genetic screening. Anticipatory guidance regarding course of prenatal care. Reviewed red flag symptoms and when to call. RTC x 4-5 weeks for ANATOMY SCAN and ROB or sooner if needed.

## 2018-01-24 NOTE — Patient Instructions (Addendum)
Second Trimester of Pregnancy The second trimester is from week 13 through week 28, month 4 through 6. This is often the time in pregnancy that you feel your best. Often times, morning sickness has lessened or quit. You may have more energy, and you may get hungry more often. Your unborn baby (fetus) is growing rapidly. At the end of the sixth month, he or she is about 9 inches long and weighs about 1 pounds. You will likely feel the baby move (quickening) between 18 and 20 weeks of pregnancy. Follow these instructions at home:  Avoid all smoking, herbs, and alcohol. Avoid drugs not approved by your doctor.  Do not use any tobacco products, including cigarettes, chewing tobacco, and electronic cigarettes. If you need help quitting, ask your doctor. You may get counseling or other support to help you quit.  Only take medicine as told by your doctor. Some medicines are safe and some are not during pregnancy.  Exercise only as told by your doctor. Stop exercising if you start having cramps.  Eat regular, healthy meals.  Wear a good support bra if your breasts are tender.  Do not use hot tubs, steam rooms, or saunas.  Wear your seat belt when driving.  Avoid raw meat, uncooked cheese, and liter boxes and soil used by cats.  Take your prenatal vitamins.  Take 1500-2000 milligrams of calcium daily starting at the 20th week of pregnancy until you deliver your baby.  Try taking medicine that helps you poop (stool softener) as needed, and if your doctor approves. Eat more fiber by eating fresh fruit, vegetables, and whole grains. Drink enough fluids to keep your pee (urine) clear or pale yellow.  Take warm water baths (sitz baths) to soothe pain or discomfort caused by hemorrhoids. Use hemorrhoid cream if your doctor approves.  If you have puffy, bulging veins (varicose veins), wear support hose. Raise (elevate) your feet for 15 minutes, 3-4 times a day. Limit salt in your diet.  Avoid  heavy lifting, wear low heals, and sit up straight.  Rest with your legs raised if you have leg cramps or low back pain.  Visit your dentist if you have not gone during your pregnancy. Use a soft toothbrush to brush your teeth. Be gentle when you floss.  You can have sex (intercourse) unless your doctor tells you not to.  Go to your doctor visits. Get help if:  You feel dizzy.  You have mild cramps or pressure in your lower belly (abdomen).  You have a nagging pain in your belly area.  You continue to feel sick to your stomach (nauseous), throw up (vomit), or have watery poop (diarrhea).  You have bad smelling fluid coming from your vagina.  You have pain with peeing (urination). Get help right away if:  You have a fever.  You are leaking fluid from your vagina.  You have spotting or bleeding from your vagina.  You have severe belly cramping or pain.  You lose or gain weight rapidly.  You have trouble catching your breath and have chest pain.  You notice sudden or extreme puffiness (swelling) of your face, hands, ankles, feet, or legs.  You have not felt the baby move in over an hour.  You have severe headaches that do not go away with medicine.  You have vision changes. This information is not intended to replace advice given to you by your health care provider. Make sure you discuss any questions you have with your health care  provider. Document Released: 05/05/2009 Document Revised: 07/17/2015 Document Reviewed: 04/11/2012 Elsevier Interactive Patient Education  2017 [employer].  Heartburn During Pregnancy Heartburn is pain or discomfort in the throat or chest. It may cause a burning feeling. It happens when stomach acid moves up into the tube that carries food from your mouth to your stomach (esophagus). Heartburn is common during pregnancy. It usually goes away or gets better after giving birth. Follow these instructions at home: Eating and drinking  Do not  drink alcohol while you are pregnant.  Figure out which foods and beverages make you feel worse, and avoid them.  Beverages that you may want to avoid include: ? Coffee and tea (with or without caffeine). ? Energy drinks and sports drinks. ? Bubbly (carbonated) drinks or sodas. ? Citrus fruit juices.  Foods that you may want to avoid include: ? Chocolate and cocoa. ? Peppermint and mint flavorings. ? Garlic, onions, and horseradish. ? Spicy and acidic foods. These include peppers, chili powder, curry powder, vinegar, hot sauces, and barbecue sauce. ? Citrus fruits, such as oranges, lemons, and limes. ? Tomato-based foods, such as red sauce, chili, and salsa. ? Fried and fatty foods, such as donuts, french fries, potato chips, and high-fat dressings. ? High-fat meats, such as hot dogs, cold cuts, sausage, ham, and bacon. ? High-fat dairy items, such as whole milk, butter, and cheese.  Eat small meals often, instead of large meals.  Avoid drinking a lot of liquid with your meals.  Avoid eating meals during the 2-3 hours before you go to bed.  Avoid lying down right after you eat.  Do not exercise right after you eat. Medicines  Take over-the-counter and prescription medicines only as told by your doctor.  Do not take aspirin, ibuprofen, or other NSAIDs unless your doctor tells you to do that.  Your doctor may tell you to avoid medicines that have sodium bicarbonate in them. General instructions  If told, raise the head of your bed about 6 inches (15 cm). You can do this by putting blocks under the legs. Sleeping with more pillows does not help with heartburn.  Do not use any products that contain nicotine or tobacco, such as cigarettes and e-cigarettes. If you need help quitting, ask your doctor.  Wear loose-fitting clothing.  Try to lower your stress, such as with yoga or meditation. If you need help, ask your doctor.  Stay at a healthy weight. If you are overweight,  work with your doctor to safely lose weight.  Keep all follow-up visits as told by your doctor. This is important. Contact a doctor if:  You get new symptoms.  Your symptoms do not get better with treatment.  You have weight loss and you do not know why.  You have trouble swallowing.  You make loud sounds when you breathe (wheeze).  You have a cough that does not go away.  You have heartburn often for more than 2 weeks.  You feel sick to your stomach (nauseous), and this does not get better with treatment.  You are throwing up (vomiting), and this does not get better with treatment.  You have pain in your belly (abdomen). Get help right away if:  You have very bad chest pain that spreads to your arm, neck, or jaw.  You feel sweaty, dizzy, or light-headed.  You have trouble breathing.  You have pain when swallowing.  You throw up and your throw-up looks like blood or coffee grounds.  Your poop (stool)  is bloody or black. This information is not intended to replace advice given to you by your health care provider. Make sure you discuss any questions you have with your health care provider. Document Released: 03/13/2010 Document Revised: 10/27/2015 Document Reviewed: 10/27/2015 Elsevier Interactive Patient Education  2017 [place].  Common Medications Safe in Pregnancy  Acne:      Constipation:  Benzoyl Peroxide     Colace  Clindamycin      Dulcolax Suppository  Topica Erythromycin     Fibercon  Salicylic Acid      Metamucil         Miralax AVOID:        Senakot   Accutane    Cough:  Retin-A       Cough Drops  Tetracycline      Phenergan w/ Codeine if Rx  Minocycline      Robitussin (Plain & DM)  Antibiotics:     Crabs/Lice:  Ceclor       RID  Cephalosporins    AVOID:  E-Mycins      Kwell  Keflex  Macrobid/Macrodantin   Diarrhea:  Penicillin      Kao-Pectate  Zithromax      Imodium AD         PUSH  FLUIDS AVOID:       Cipro     Fever:  Tetracycline      Tylenol (Regular or Extra  Minocycline       Strength)  Levaquin      Extra Strength-Do not          Exceed 8 tabs/24 hrs Caffeine:        <222m/day (equiv. To 1 cup of coffee or  approx. 3 12 oz sodas)         Gas: Cold/Hayfever:       Gas-X  Benadryl      Mylicon  Claritin       Phazyme  **Claritin-D        Chlor-Trimeton    Headaches:  Dimetapp      ASA-Free Excedrin  Drixoral-Non-Drowsy     Cold Compress  Mucinex (Guaifenasin)     Tylenol (Regular or Extra  Sudafed/Sudafed-12 Hour     Strength)  **Sudafed PE Pseudoephedrine   Tylenol Cold & Sinus     Vicks Vapor Rub  Zyrtec  **AVOID if Problems With Blood Pressure         Heartburn: Avoid lying down for at least 1 hour after meals  Aciphex      Maalox     Rash:  Milk of Magnesia     Benadryl    Mylanta       1% Hydrocortisone Cream  Pepcid  Pepcid Complete   Sleep Aids:  Prevacid      Ambien   Prilosec       Benadryl  Rolaids       Chamomile Tea  Tums (Limit 4/day)     Unisom         Tylenol PM         Warm milk-add vanilla or  Hemorrhoids:       Sugar for taste  Anusol/Anusol H.C.  (RX: Analapram 2.5%)  Sugar Substitutes:  Hydrocortisone OTC     Ok in moderation  Preparation H      Tucks        Vaseline lotion applied to tissue with wiping    Herpes:     Throat:  Acyclovir  Oragel  Famvir  Valtrex     Vaccines:         Flu Shot Leg Cramps:       *Gardasil  Benadryl      Hepatitis A         Hepatitis B Nasal Spray:       Pneumovax  Saline Nasal Spray     Polio Booster         Tetanus Nausea:       Tuberculosis test or PPD  Vitamin B6 25 mg TID   AVOID:    Dramamine      *Gardasil  Emetrol       Live Poliovirus  Ginger Root 250 mg QID    MMR (measles, mumps &  High Complex Carbs @ Bedtime    rebella)  Sea Bands-Accupressure    Varicella (Chickenpox)  Unisom 1/2 tab TID     *No known complications           If received  before Pain:         Known pregnancy;   Darvocet       Resume series after  Lortab        Delivery  Percocet    Yeast:   Tramadol      Femstat  Tylenol 3      Gyne-lotrimin  Ultram       Monistat  Vicodin           MISC:         All Sunscreens           Hair Coloring/highlights          Insect Repellant's          (Including DEET)         Mystic Tans WHAT OB PATIENTS CAN EXPECT   Confirmation of pregnancy and ultrasound ordered if medically indicated-[redacted] weeks gestation  New OB (NOB) intake with nurse and New OB (NOB) labs- [redacted] weeks gestation  New OB (NOB) physical examination with provider- 11/[redacted] weeks gestation  Flu vaccine-[redacted] weeks gestation  Anatomy scan-[redacted] weeks gestation  Glucose tolerance test, blood work to test for anemia, T-dap vaccine-[redacted] weeks gestation  Vaginal swabs/cultures-STD/Group B strep-[redacted] weeks gestation  Appointments every 4 weeks until 28 weeks  Every 2 weeks from 28 weeks until 36 weeks  Weekly visits from 36 weeks until delivery

## 2018-02-13 ENCOUNTER — Encounter: Payer: Self-pay | Admitting: Certified Nurse Midwife

## 2018-02-22 NOTE — L&D Delivery Note (Signed)
Delivery Note At  2330 a viable and healthy female was delivered via  (Presentation:LOA ;  ) after 1 1/2 hours of effective pushing.  APGAR:8 ,9 ; weight pending  .   Placenta status: delivered intact with 3 vessel  Cord:  with the following complications: none  Anesthesia:  epidural Episiotomy:  none Lacerations:  2nd degree Suture Repair: 3.0 vicryl rapide Est. Blood Loss (mL):  800  Mom to postpartum.  Baby to Couplet care / Skin to Skin.  Acasia Skilton N Silverio Hagan 07/09/2018, 11:53 PM

## 2018-02-23 ENCOUNTER — Other Ambulatory Visit: Payer: Self-pay | Admitting: Certified Nurse Midwife

## 2018-02-23 DIAGNOSIS — Z3482 Encounter for supervision of other normal pregnancy, second trimester: Secondary | ICD-10-CM

## 2018-02-24 ENCOUNTER — Ambulatory Visit (INDEPENDENT_AMBULATORY_CARE_PROVIDER_SITE_OTHER): Payer: Managed Care, Other (non HMO) | Admitting: Certified Nurse Midwife

## 2018-02-24 ENCOUNTER — Encounter: Payer: Self-pay | Admitting: Certified Nurse Midwife

## 2018-02-24 ENCOUNTER — Ambulatory Visit (INDEPENDENT_AMBULATORY_CARE_PROVIDER_SITE_OTHER): Payer: Managed Care, Other (non HMO)

## 2018-02-24 VITALS — BP 114/59 | HR 75 | Wt 148.0 lb

## 2018-02-24 DIAGNOSIS — Z3482 Encounter for supervision of other normal pregnancy, second trimester: Secondary | ICD-10-CM

## 2018-02-24 DIAGNOSIS — Z3402 Encounter for supervision of normal first pregnancy, second trimester: Secondary | ICD-10-CM

## 2018-02-24 NOTE — Patient Instructions (Signed)
[facility]   [facility]  [address], [place], [place] [address]  Phone: [phone]   [facility] (second location)  [address] [place], [place] [address]  Phone: [phone]   [facility] ([place]) [address], [place], [place] [address] Phone: [phone]   [facility]  [address] , [place] [address]  Phone: [phone] Ligament Pain  The round ligament is a cord of muscle and tissue that helps support the uterus. It can become a source of pain during pregnancy if it becomes stretched or twisted as the baby grows. The pain usually begins in the second trimester (13-28 weeks) of pregnancy, and it can come and go until the baby is delivered. It is not a serious problem, and it does not cause harm to the baby. Round ligament pain is usually a short, sharp, and pinching pain, but it can also be a dull, lingering, and aching pain. The pain is felt in the lower side of the abdomen or in the groin. It usually starts deep in the groin and moves up to the outside of the hip area. The pain may occur when you:  Suddenly change position, such as quickly going from a sitting to standing position.  Roll over in bed.  Cough or sneeze.  Do physical activity. Follow these instructions at home:   Watch your condition for any changes.  When the pain starts, relax. Then try any of these methods to help with the pain: ? Sitting down. ? Flexing your knees up to your abdomen. ? Lying on your side with one pillow under your abdomen and another pillow between your legs. ? Sitting in a warm bath for 15-20 minutes or until the pain goes away.  Take over-the-counter and prescription medicines only as told by your health care provider.  Move slowly when you sit down or stand up.  Avoid long walks if they cause pain.  Stop or reduce your physical activities if they cause pain.  Keep all follow-up  visits as told by your health care provider. This is important. Contact a health care provider if:  Your pain does not go away with treatment.  You feel pain in your back that you did not have before.  Your medicine is not helping. Get help right away if:  You have a fever or chills.  You develop uterine contractions.  You have vaginal bleeding.  You have nausea or vomiting.  You have diarrhea.  You have pain when you urinate. Summary  Round ligament pain is felt in the lower abdomen or groin. It is usually a short, sharp, and pinching pain. It can also be a dull, lingering, and aching pain.  This pain usually begins in the second trimester (13-28 weeks). It occurs because the uterus is stretching with the growing baby, and it is not harmful to the baby.  You may notice the pain when you suddenly change position, when you cough or sneeze, or during physical activity.  Relaxing, flexing your knees to your abdomen, lying on one side, or taking a warm bath may help to get rid of the pain.  Get help from your health care provider if the pain does not go away or if you have vaginal bleeding, nausea, vomiting, diarrhea, or painful urination. This information is not intended to replace advice given to you by your health care provider. Make sure you discuss any questions you have with your health care provider. Document   Released: 11/18/2007 Document Revised: 07/27/2017 Document Reviewed: 07/27/2017 Elsevier Interactive Patient Education  2019 Elsevier Inc.  

## 2018-02-24 NOTE — Progress Notes (Signed)
ROB, doing well. Feels good movement. Discussed round ligament pain and common discomforts of pregnancy. Encouraged use of belly band for back/hip and uterine support. Note given today for work breaks. Pt is bartender and stands all day. Note given to allow pt to sit q 2-3 hrs for 10-15 min.U/s today for anatomy WNL but is incomplete. ( see below) Pt to return in 1-2 wks for u/s and in 4 wks for ROB with Melody.   Doreene Burke, CNM   Patient Name: [facility] DOB: 13-Apr-1990 MRN: [id number]  ULTRASOUND REPORT  Location: [facility] Date of Service: 02/24/2018   Indications:Anatomy Ultrasound Findings:  Mason Jim intrauterine pregnancy is visualized with FHR at 147 BPM. Biometrics give an (U/S) Gestational age of [redacted]w[redacted]d and an (U/S) EDD of 07/09/18; this correlates with the clinically established Estimated Date of Delivery: 07/05/18  Fetal presentation is Cephalic.  EFW: wnl. Placenta: posterior. Grade: 0 AFI: subjectively normal.  Anatomic survey is incomplete for LVOT,RVOT, and profile d/t fetal position and normal; Gender - female.    Right Ovary is normal in appearance. Left Ovary is normal appearance. Survey of the adnexa demonstrates no adnexal masses. There is no free peritoneal fluid in the cul de sac.  Impression: 1. [redacted]w[redacted]d Viable Singleton Intrauterine pregnancy by U/S. 2. (U/S) EDD is consistent with Clinically established Estimated Date of Delivery: 07/05/18 . 3. Normal Anatomy Scan, incomplete for LVOT,RVOT, and profile d/t fetal position 4. Fetal stomach appears mildly dilated. It needs a f/u US   Recommendations: 1.Clinical correlation with the patient's History and Physical Exam. 2.   Abeer Alsammarraie,RDMS

## 2018-03-08 ENCOUNTER — Ambulatory Visit (INDEPENDENT_AMBULATORY_CARE_PROVIDER_SITE_OTHER): Payer: Managed Care, Other (non HMO)

## 2018-03-08 DIAGNOSIS — Z3402 Encounter for supervision of normal first pregnancy, second trimester: Secondary | ICD-10-CM

## 2018-03-24 ENCOUNTER — Ambulatory Visit (INDEPENDENT_AMBULATORY_CARE_PROVIDER_SITE_OTHER): Payer: Managed Care, Other (non HMO) | Admitting: Obstetrics and Gynecology

## 2018-03-24 VITALS — BP 117/61 | HR 80 | Wt 155.9 lb

## 2018-03-24 DIAGNOSIS — Z3492 Encounter for supervision of normal pregnancy, unspecified, second trimester: Secondary | ICD-10-CM

## 2018-03-24 LAB — POCT URINALYSIS DIPSTICK OB
Bilirubin, UA: NEGATIVE
Blood, UA: NEGATIVE
GLUCOSE, UA: NEGATIVE
KETONES UA: NEGATIVE
Leukocytes, UA: NEGATIVE
Nitrite, UA: NEGATIVE
POC,PROTEIN,UA: NEGATIVE
Spec Grav, UA: 1.015 (ref 1.010–1.025)
Urobilinogen, UA: 0.2 E.U./dL
pH, UA: 7 (ref 5.0–8.0)

## 2018-03-24 NOTE — Progress Notes (Signed)
ROB- pt is doing well 

## 2018-03-24 NOTE — Progress Notes (Signed)
ROB- doing well, glucola at next visit. Encouraged to enroll in classes.

## 2018-04-17 ENCOUNTER — Other Ambulatory Visit: Payer: Managed Care, Other (non HMO)

## 2018-04-17 ENCOUNTER — Ambulatory Visit (INDEPENDENT_AMBULATORY_CARE_PROVIDER_SITE_OTHER): Payer: Managed Care, Other (non HMO) | Admitting: Certified Nurse Midwife

## 2018-04-17 VITALS — BP 117/63 | HR 77 | Wt 162.5 lb

## 2018-04-17 DIAGNOSIS — Z130 Encounter for screening for diseases of the blood and blood-forming organs and certain disorders involving the immune mechanism: Secondary | ICD-10-CM

## 2018-04-17 DIAGNOSIS — Z3493 Encounter for supervision of normal pregnancy, unspecified, third trimester: Secondary | ICD-10-CM

## 2018-04-17 DIAGNOSIS — Z23 Encounter for immunization: Secondary | ICD-10-CM

## 2018-04-17 DIAGNOSIS — Z131 Encounter for screening for diabetes mellitus: Secondary | ICD-10-CM

## 2018-04-17 LAB — POCT URINALYSIS DIPSTICK OB
Bilirubin, UA: NEGATIVE
Blood, UA: NEGATIVE
Glucose, UA: NEGATIVE
Ketones, UA: NEGATIVE
Leukocytes, UA: NEGATIVE
Nitrite, UA: NEGATIVE
Spec Grav, UA: 1.01 (ref 1.010–1.025)
Urobilinogen, UA: 0.2 E.U./dL
pH, UA: 7 (ref 5.0–8.0)

## 2018-04-17 MED ORDER — TETANUS-DIPHTH-ACELL PERTUSSIS 5-2.5-18.5 LF-MCG/0.5 IM SUSP
0.5000 mL | Freq: Once | INTRAMUSCULAR | Status: AC
  Administered 2018-04-17: 0.5 mL via INTRAMUSCULAR

## 2018-04-17 NOTE — Patient Instructions (Signed)
WHAT OB PATIENTS CAN EXPECT   Confirmation of pregnancy and ultrasound ordered if medically indicated-[redacted] weeks gestation  New OB (NOB) intake with nurse and New OB (NOB) labs- [redacted] weeks gestation  New OB (NOB) physical examination with provider- 11/[redacted] weeks gestation  Flu vaccine-[redacted] weeks gestation  Anatomy scan-[redacted] weeks gestation  Glucose tolerance test, blood work to test for anemia, T-dap vaccine-[redacted] weeks gestation  Vaginal swabs/cultures-STD/Group B strep-[redacted] weeks gestation  Appointments every 4 weeks until 28 weeks  Every 2 weeks from 28 weeks until 36 weeks  Weekly visits from 36 weeks until delivery  Third Trimester of Pregnancy  The third trimester is from week 28 through week 40 (months 7 through 9). This trimester is when your unborn baby (fetus) is growing very fast. At the end of the ninth month, the unborn baby is about 20 inches in length. It weighs about 6-10 pounds. Follow these instructions at home: Medicines  Take over-the-counter and prescription medicines only as told by your doctor. Some medicines are safe and some medicines are not safe during pregnancy.  Take a prenatal vitamin that contains at least 600 micrograms (mcg) of folic acid.  If you have trouble pooping (constipation), take medicine that will make your stool soft (stool softener) if your doctor approves. Eating and drinking   Eat regular, healthy meals.  Avoid raw meat and uncooked cheese.  If you get low calcium from the food you eat, talk to your doctor about taking a daily calcium supplement.  Eat four or five small meals rather than three large meals a day.  Avoid foods that are high in fat and sugars, such as fried and sweet foods.  To prevent constipation: ? Eat foods that are high in fiber, like fresh fruits and vegetables, whole grains, and beans. ? Drink enough fluids to keep your pee (urine) clear or pale yellow. Activity  Exercise only as told by your doctor. Stop  exercising if you start to have cramps.  Avoid heavy lifting, wear low heels, and sit up straight.  Do not exercise if it is too hot, too humid, or if you are in a place of great height (high altitude).  You may continue to have sex unless your doctor tells you not to. Relieving pain and discomfort  Wear a good support bra if your breasts are tender.  Take frequent breaks and rest with your legs raised if you have leg cramps or low back pain.  Take warm water baths (sitz baths) to soothe pain or discomfort caused by hemorrhoids. Use hemorrhoid cream if your doctor approves.  If you develop puffy, bulging veins (varicose veins) in your legs: ? Wear support hose or compression stockings as told by your doctor. ? Raise (elevate) your feet for 15 minutes, 3-4 times a day. ? Limit salt in your food. Safety  Wear your seat belt when driving.  Make a list of emergency phone numbers, including numbers for family, friends, the hospital, and police and fire departments. Preparing for your baby's arrival To prepare for the arrival of your baby:  Take prenatal classes.  Practice driving to the hospital.  Visit the hospital and tour the maternity area.  Talk to your work about taking leave once the baby comes.  Pack your hospital bag.  Prepare the baby's room.  Go to your doctor visits.  Buy a rear-facing car seat. Learn how to install it in your car. General instructions  Do not use hot tubs, steam rooms, or saunas.    Do not use any products that contain nicotine or tobacco, such as cigarettes and e-cigarettes. If you need help quitting, ask your doctor.  Do not drink alcohol.  Do not douche or use tampons or scented sanitary pads.  Do not cross your legs for long periods of time.  Do not travel for long distances unless you must. Only do so if your doctor says it is okay.  Visit your dentist if you have not gone during your pregnancy. Use a soft toothbrush to brush your  teeth. Be gentle when you floss.  Avoid cat litter boxes and soil used by cats. These carry germs that can cause birth defects in the baby and can cause a loss of your baby (miscarriage) or stillbirth.  Keep all your prenatal visits as told by your doctor. This is important. Contact a doctor if:  You are not sure if you are in labor or if your water has broken.  You are dizzy.  You have mild cramps or pressure in your lower belly.  You have a nagging pain in your belly area.  You continue to feel sick to your stomach, you throw up, or you have watery poop.  You have bad smelling fluid coming from your vagina.  You have pain when you pee. Get help right away if:  You have a fever.  You are leaking fluid from your vagina.  You are spotting or bleeding from your vagina.  You have severe belly cramps or pain.  You lose or gain weight quickly.  You have trouble catching your breath and have chest pain.  You notice sudden or extreme puffiness (swelling) of your face, hands, ankles, feet, or legs.  You have not felt the baby move in over an hour.  You have severe headaches that do not go away with medicine.  You have trouble seeing.  You are leaking, or you are having a gush of fluid, from your vagina before you are 37 weeks.  You have regular belly spasms (contractions) before you are 37 weeks. Summary  The third trimester is from week 28 through week 40 (months 7 through 9). This time is when your unborn baby is growing very fast.  Follow your doctor's advice about medicine, food, and activity.  Get ready for the arrival of your baby by taking prenatal classes, getting all the baby items ready, preparing the baby's room, and visiting your doctor to be checked.  Get help right away if you are bleeding from your vagina, or you have chest pain and trouble catching your breath, or if you have not felt your baby move in over an hour. This information is not intended to  replace advice given to you by your health care provider. Make sure you discuss any questions you have with your health care provider. Document Released: 05/05/2009 Document Revised: 03/16/2016 Document Reviewed: 03/16/2016 Elsevier Interactive Patient Education  2019 Elsevier Inc.  

## 2018-04-17 NOTE — Progress Notes (Addendum)
ROB- Doing well. Reports baby girl's name is Odelia Gage. Reports constipation. Encouraged bowel regimen and adequate hydration. Labs performed, Tdap given, and blood consent signed. Plans to breastfeed and have an epidural. Anticipatory guidance given regarding course of prenatal care. Handouts given- Doula support and Three sisters of balance. Reviewed red flag symptoms and when to call. RTC x 2 weeks for ROB with Pattricia Boss.

## 2018-04-17 NOTE — Progress Notes (Signed)
ROB- glucola done, blood consent signed, tdap given, pt is doing well 

## 2018-04-18 LAB — CBC
Hematocrit: 32 % — ABNORMAL LOW (ref 34.0–46.6)
Hemoglobin: 11.5 g/dL (ref 11.1–15.9)
MCH: 34.8 pg — ABNORMAL HIGH (ref 26.6–33.0)
MCHC: 35.9 g/dL — ABNORMAL HIGH (ref 31.5–35.7)
MCV: 97 fL (ref 79–97)
Platelets: 189 10*3/uL (ref 150–450)
RBC: 3.3 x10E6/uL — ABNORMAL LOW (ref 3.77–5.28)
RDW: 12.1 % (ref 11.7–15.4)
WBC: 8.1 10*3/uL (ref 3.4–10.8)

## 2018-04-18 LAB — RPR: RPR Ser Ql: NONREACTIVE

## 2018-04-18 LAB — GLUCOSE, 1 HOUR GESTATIONAL: Gestational Diabetes Screen: 89 mg/dL (ref 65–139)

## 2018-04-25 ENCOUNTER — Telehealth: Payer: Self-pay | Admitting: Obstetrics and Gynecology

## 2018-04-25 ENCOUNTER — Encounter: Payer: Self-pay | Admitting: Certified Nurse Midwife

## 2018-04-25 ENCOUNTER — Telehealth: Payer: Self-pay | Admitting: Certified Nurse Midwife

## 2018-04-25 ENCOUNTER — Other Ambulatory Visit: Payer: Self-pay | Admitting: Certified Nurse Midwife

## 2018-04-25 ENCOUNTER — Ambulatory Visit (INDEPENDENT_AMBULATORY_CARE_PROVIDER_SITE_OTHER): Payer: Managed Care, Other (non HMO) | Admitting: Certified Nurse Midwife

## 2018-04-25 VITALS — BP 115/64 | HR 84 | Temp 98.8°F | Wt 162.2 lb

## 2018-04-25 DIAGNOSIS — Z3403 Encounter for supervision of normal first pregnancy, third trimester: Secondary | ICD-10-CM

## 2018-04-25 LAB — POCT URINALYSIS DIPSTICK OB
Bilirubin, UA: NEGATIVE
Glucose, UA: NEGATIVE
Ketones, UA: NEGATIVE
Leukocytes, UA: NEGATIVE
Nitrite, UA: NEGATIVE
POC,PROTEIN,UA: NEGATIVE
RBC UA: NEGATIVE
Spec Grav, UA: 1.01 (ref 1.010–1.025)
Urobilinogen, UA: 0.2 E.U./dL
pH, UA: 5 (ref 5.0–8.0)

## 2018-04-25 MED ORDER — OXYCODONE-ACETAMINOPHEN 5-325 MG PO TABS: 1.0000 | ORAL_TABLET | Freq: Four times a day (QID) | ORAL | 0 refills | Status: DC | PRN

## 2018-04-25 NOTE — Telephone Encounter (Signed)
Kim called from the [place] [facility] and stated that the patient was too far along to be able to see the RLQ appendix very well, and would be better to have an MRI.  They can see the gallbladder ok, but this would then cause the patient to have a duplicate study done.  They are stating the patient is due in at 8 AM, and may want to reschedule her for the MRI instead, please advise, thanks.

## 2018-04-25 NOTE — Telephone Encounter (Signed)
I want to keep appointment for u/s. Dont feel like at this point her symptoms warrant MRI given she is afebrile.   Thanks,  Pattricia Boss

## 2018-04-25 NOTE — Telephone Encounter (Signed)
Pt came in saw [facility] 04/25/18

## 2018-04-25 NOTE — Progress Notes (Signed)
Pt presents today for problem visit. She complains of right sided pain that started this morning. It is increasing and decreasing in intensity but has been constant since onset.  It radiates around her side to her back. She rates the pain 7-8/10. She denies pain with urination, CVA tenderness absent. Mildly positive murphy sign. Abdomen palpates soft, no indication of contractions.  Blood work today: CBC, CMP, lipase & amylase. Abdominal u/s ordered.

## 2018-04-25 NOTE — Patient Instructions (Signed)
Back Pain in Pregnancy  Back pain during pregnancy is common. Back pain may be caused by several factors that are related to changes during your pregnancy.  Follow these instructions at home:  Managing pain, stiffness, and swelling          If directed, for sudden (acute) back pain, put ice on the painful area.  ? Put ice in a plastic bag.  ? Place a towel between your skin and the bag.  ? Leave the ice on for 20 minutes, 2-3 times per day.   If directed, apply heat to the affected area before you exercise. Use the heat source that your health care provider recommends, such as a moist heat pack or a heating pad.  ? Place a towel between your skin and the heat source.  ? Leave the heat on for 20-30 minutes.  ? Remove the heat if your skin turns bright red. This is especially important if you are unable to feel pain, heat, or cold. You may have a greater risk of getting burned.   If directed, massage the affected area.  Activity   Exercise as told by your health care provider. Gentle exercise is the best way to prevent or manage back pain.   Listen to your body when lifting. If lifting hurts, ask for help or bend your knees. This uses your leg muscles instead of your back muscles.   Squat down when picking up something from the floor. Do not bend over.   Only use bed rest for short periods as told by your health care provider. Bed rest should only be used for the most severe episodes of back pain.  Standing, sitting, and lying down   Do not stand in one place for long periods of time.   Use good posture when sitting. Make sure your head rests over your shoulders and is not hanging forward. Use a pillow on your lower back if necessary.   Try sleeping on your side, preferably the left side, with a pregnancy support pillow or 1-2 regular pillows between your legs.  ? If you have back pain after a night's rest, your bed may be too soft.  ? A firm mattress may provide more support for your back during  pregnancy.  General instructions   Do not wear high heels.   Eat a healthy diet. Try to gain weight within your health care provider's recommendations.   Use a maternity girdle, elastic sling, or back brace as told by your health care provider.   Take over-the-counter and prescription medicines only as told by your health care provider.   Work with a physical therapist or massage therapist to find ways to manage back pain. Acupuncture or massage therapy may be helpful.   Keep all follow-up visits as told by your health care provider. This is important.  Contact a health care provider if:   Your back pain interferes with your daily activities.   You have increasing pain in other parts of your body.  Get help right away if:   You develop numbness, tingling, weakness, or problems with the use of your arms or legs.   You develop severe back pain that is not controlled with medicine.   You have a change in bowel or bladder control.   You develop shortness of breath, dizziness, or you faint.   You develop nausea, vomiting, or sweating.   You have back pain that is a rhythmic, cramping pain similar to labor pains. Labor   pain is usually 1-2 minutes apart, lasts for about 1 minute, and involves a bearing down feeling or pressure in your pelvis.   You have back pain and your water breaks or you have vaginal bleeding.   You have back pain or numbness that travels down your leg.   Your back pain developed after you fell.   You develop pain on one side of your back.   You see blood in your urine.   You develop skin blisters in the area of your back pain.  Summary   Back pain may be caused by several factors that are related to changes during your pregnancy.   Follow instructions as told by your health care provider for managing pain, stiffness, and swelling.   Exercise as told by your health care provider. Gentle exercise is the best way to prevent or manage back pain.   Take over-the-counter and  prescription medicines only as told by your health care provider.   Keep all follow-up visits as told by your health care provider. This is important.  This information is not intended to replace advice given to you by your health care provider. Make sure you discuss any questions you have with your health care provider.  Document Released: 05/19/2005 Document Revised: 07/27/2017 Document Reviewed: 07/27/2017  Elsevier Interactive Patient Education  2019 Elsevier Inc.

## 2018-04-25 NOTE — Telephone Encounter (Signed)
The patient called and stated that she is 30 weeks and thinks she is experiencing contractions, I asked the patient (how long and how far apart are the contractions?) The patient stated she does not know. Please advise.

## 2018-04-26 ENCOUNTER — Other Ambulatory Visit: Payer: Self-pay | Admitting: Obstetrics and Gynecology

## 2018-04-26 ENCOUNTER — Other Ambulatory Visit: Payer: Self-pay

## 2018-04-26 ENCOUNTER — Ambulatory Visit
Admission: RE | Admit: 2018-04-26 | Discharge: 2018-04-26 | Disposition: A | Discharge status: Home / Self Care | Payer: Managed Care, Other (non HMO) | Source: Ambulatory Visit | Attending: Certified Nurse Midwife | Admitting: Certified Nurse Midwife

## 2018-04-26 ENCOUNTER — Other Ambulatory Visit: Payer: Self-pay | Admitting: Certified Nurse Midwife

## 2018-04-26 DIAGNOSIS — R1011 Right upper quadrant pain: Secondary | ICD-10-CM

## 2018-04-26 DIAGNOSIS — R1031 Right lower quadrant pain: Secondary | ICD-10-CM

## 2018-04-26 DIAGNOSIS — N1330 Unspecified hydronephrosis: Secondary | ICD-10-CM

## 2018-04-26 DIAGNOSIS — Z3403 Encounter for supervision of normal first pregnancy, third trimester: Secondary | ICD-10-CM | POA: Insufficient documentation

## 2018-04-26 DIAGNOSIS — O358XX0 Maternal care for other (suspected) fetal abnormality and damage, not applicable or unspecified: Secondary | ICD-10-CM

## 2018-04-26 DIAGNOSIS — O35EXX0 Maternal care for other (suspected) fetal abnormality and damage, fetal genitourinary anomalies, not applicable or unspecified: Secondary | ICD-10-CM

## 2018-04-26 DIAGNOSIS — Z3493 Encounter for supervision of normal pregnancy, unspecified, third trimester: Secondary | ICD-10-CM

## 2018-04-26 LAB — AMYLASE: AMYLASE: 73 U/L (ref 31–110)

## 2018-04-26 LAB — COMPREHENSIVE METABOLIC PANEL
ALK PHOS: 74 IU/L (ref 39–117)
ALT: 12 IU/L (ref 0–32)
AST: 14 IU/L (ref 0–40)
Albumin/Globulin Ratio: 1.5 (ref 1.2–2.2)
Albumin: 3.4 g/dL — ABNORMAL LOW (ref 3.9–5.0)
BUN/Creatinine Ratio: 11 (ref 9–23)
BUN: 6 mg/dL (ref 6–20)
Bilirubin Total: 0.3 mg/dL (ref 0.0–1.2)
CO2: 18 mmol/L — ABNORMAL LOW (ref 20–29)
Calcium: 8.8 mg/dL (ref 8.7–10.2)
Chloride: 105 mmol/L (ref 96–106)
Creatinine, Ser: 0.53 mg/dL — ABNORMAL LOW (ref 0.57–1.00)
GFR calc Af Amer: 152 mL/min/{1.73_m2} (ref >59)
GFR calc non Af Amer: 131 mL/min/{1.73_m2} (ref >59)
Globulin, Total: 2.2 g/dL (ref 1.5–4.5)
Glucose: 87 mg/dL (ref 65–99)
Potassium: 4 mmol/L (ref 3.5–5.2)
Sodium: 138 mmol/L (ref 134–144)
Total Protein: 5.6 g/dL — ABNORMAL LOW (ref 6.0–8.5)

## 2018-04-26 LAB — CBC
Hematocrit: 32.7 % — ABNORMAL LOW (ref 34.0–46.6)
Hemoglobin: 11.7 g/dL (ref 11.1–15.9)
MCH: 34.5 pg — ABNORMAL HIGH (ref 26.6–33.0)
MCHC: 35.8 g/dL — AB (ref 31.5–35.7)
MCV: 97 fL (ref 79–97)
Platelets: 190 10*3/uL (ref 150–450)
RBC: 3.39 x10E6/uL — ABNORMAL LOW (ref 3.77–5.28)
RDW: 12.1 % (ref 11.7–15.4)
WBC: 9.4 10*3/uL (ref 3.4–10.8)

## 2018-04-26 LAB — LIPASE: Lipase: 22 U/L (ref 14–72)

## 2018-04-26 NOTE — Progress Notes (Signed)
PT u/s reveals moderate hydronephrosis. Pt is symptomatic with right upper quadrant pain.  Renal u/s shows: Renal measurements: 10.9 x 6.6 x 5.7 cm = volume: 214 mL. Moderate right hydronephrosis. No mass. Normal echotexture.  Order placed for referral.  Doreene Burke, CNM

## 2018-04-27 ENCOUNTER — Telehealth: Payer: Self-pay | Admitting: *Deleted

## 2018-04-27 DIAGNOSIS — O358XX0 Maternal care for other (suspected) fetal abnormality and damage, not applicable or unspecified: Secondary | ICD-10-CM

## 2018-04-27 DIAGNOSIS — O35EXX0 Maternal care for other (suspected) fetal abnormality and damage, fetal genitourinary anomalies, not applicable or unspecified: Secondary | ICD-10-CM

## 2018-04-27 NOTE — Telephone Encounter (Signed)
Correction: please send referral to urology

## 2018-04-27 NOTE — Telephone Encounter (Signed)
Please send referral to [facility] nephrology

## 2018-05-01 ENCOUNTER — Encounter: Payer: Self-pay | Admitting: Certified Nurse Midwife

## 2018-05-01 ENCOUNTER — Ambulatory Visit (INDEPENDENT_AMBULATORY_CARE_PROVIDER_SITE_OTHER): Payer: Medicaid Other | Admitting: Certified Nurse Midwife

## 2018-05-01 VITALS — BP 110/56 | HR 77 | Wt 163.4 lb

## 2018-05-01 DIAGNOSIS — Z3403 Encounter for supervision of normal first pregnancy, third trimester: Secondary | ICD-10-CM

## 2018-05-01 LAB — POCT URINALYSIS DIPSTICK OB
Bilirubin, UA: NEGATIVE
Blood, UA: NEGATIVE
Glucose, UA: NEGATIVE
Ketones, UA: NEGATIVE
Leukocytes, UA: NEGATIVE
Nitrite, UA: NEGATIVE
PH UA: 6.5 (ref 5.0–8.0)
POC,PROTEIN,UA: NEGATIVE
Spec Grav, UA: 1.015 (ref 1.010–1.025)
Urobilinogen, UA: 0.2 E.U./dL

## 2018-05-01 NOTE — Patient Instructions (Signed)
How a Baby Grows During Pregnancy    Pregnancy begins when a female's sperm enters a female's egg (fertilization). Fertilization usually happens in one of the tubes (fallopian tubes) that connect the ovaries to the womb (uterus). The fertilized egg moves down the fallopian tube to the uterus. Once it reaches the uterus, it implants into the lining of the uterus and begins to grow.  For the first 10 weeks, the fertilized egg is called an embryo. After 10 weeks, it is called a fetus. As the fetus continues to grow, it receives oxygen and nutrients through tissue (placenta) that grows to support the developing baby. The placenta is the life support system for the baby. It provides oxygen and nutrition and removes waste.  Learning as much as you can about your pregnancy and how your baby is developing can help you enjoy the experience. It can also make you aware of when there might be a problem and when to ask questions.  How long does a typical pregnancy last?  A pregnancy usually lasts 280 days, or about 40 weeks. Pregnancy is divided into three periods of growth, also called trimesters:   First trimester: 0-12 weeks.   Second trimester: 13-27 weeks.   Third trimester: 28-40 weeks.  The day when your baby is ready to be born (full term) is your estimated date of delivery.  How does my baby develop month by month?  First month   The fertilized egg attaches to the inside of the uterus.   Some cells will form the placenta. Others will form the fetus.   The arms, legs, brain, spinal cord, lungs, and heart begin to develop.   At the end of the first month, the heart begins to beat.  Second month   The bones, inner ear, eyelids, hands, and feet form.   The genitals develop.   By the end of 8 weeks, all major organs are developing.  Third month   All of the internal organs are forming.   Teeth develop below the gums.   Bones and muscles begin to grow. The spine can flex.   The skin is transparent.   Fingernails  and toenails begin to form.   Arms and legs continue to grow longer, and hands and feet develop.   The fetus is about 3 inches (7.6 cm) long.  Fourth month   The placenta is completely formed.   The external sex organs, neck, outer ear, eyebrows, eyelids, and fingernails are formed.   The fetus can hear, swallow, and move its arms and legs.   The kidneys begin to produce urine.   The skin is covered with a white, waxy coating (vernix) and very fine hair (lanugo).  Fifth month   The fetus moves around more and can be felt for the first time (quickening).   The fetus starts to sleep and wake up and may begin to suck its finger.   The nails grow to the end of the fingers.   The organ in the digestive system that makes bile (gallbladder) functions and helps to digest nutrients.   If your baby is a girl, eggs are present in her ovaries. If your baby is a boy, testicles start to move down into his scrotum.  Sixth month   The lungs are formed.   The eyes open. The brain continues to develop.   Your baby has fingerprints and toe prints. Your baby's hair grows thicker.   At the end of the second trimester, the   fetus is about 9 inches (22.9 cm) long.  Seventh month   The fetus kicks and stretches.   The eyes are developed enough to sense changes in light.   The hands can make a grasping motion.   The fetus responds to sound.  Eighth month   All organs and body systems are fully developed and functioning.   Bones harden, and taste buds develop. The fetus may hiccup.   Certain areas of the brain are still developing. The skull remains soft.  Ninth month   The fetus gains about  lb (0.23 kg) each week.   The lungs are fully developed.   Patterns of sleep develop.   The fetus's head typically moves into a head-down position (vertex) in the uterus to prepare for birth.   The fetus weighs 6-9 lb (2.72-4.08 kg) and is 19-20 inches (48.26-50.8 cm) long.  What can I do to have a healthy pregnancy and help  my baby develop?  General instructions   Take prenatal vitamins as directed by your health care provider. These include vitamins such as folic acid, iron, calcium, and vitamin D. They are important for healthy development.   Take medicines only as directed by your health care provider. Read labels and ask a pharmacist or your health care provider whether over-the-counter medicines, supplements, and prescription drugs are safe to take during pregnancy.   Keep all follow-up visits as directed by your health care provider. This is important. Follow-up visits include prenatal care and screening tests.  How do I know if my baby is developing well?  At each prenatal visit, your health care provider will do several different tests to check on your health and keep track of your baby's development. These include:   Fundal height and position.  ? Your health care provider will measure your growing belly from your pubic bone to the top of the uterus using a tape measure.  ? Your health care provider will also feel your belly to determine your baby's position.   Heartbeat.  ? An ultrasound in the first trimester can confirm pregnancy and show a heartbeat, depending on how far along you are.  ? Your health care provider will check your baby's heart rate at every prenatal visit.   Second trimester ultrasound.  ? This ultrasound checks your baby's development. It also may show your baby's gender.  What should I do if I have concerns about my baby's development?  Always talk with your health care provider about any concerns that you may have about your pregnancy and your baby.  Summary   A pregnancy usually lasts 280 days, or about 40 weeks. Pregnancy is divided into three periods of growth, also called trimesters.   Your health care provider will monitor your baby's growth and development throughout your pregnancy.   Follow your health care provider's recommendations about taking prenatal vitamins and medicines during  your pregnancy.   Talk with your health care provider if you have any concerns about your pregnancy or your developing baby.  This information is not intended to replace advice given to you by your health care provider. Make sure you discuss any questions you have with your health care provider.  Document Released: 07/28/2007 Document Revised: 12/22/2016 Document Reviewed: 12/22/2016  Elsevier Interactive Patient Education  2019 Elsevier Inc.

## 2018-05-01 NOTE — Progress Notes (Signed)
ROB doing well. Pt states she has appointment next week with urology but the pain has resolved. Recommend pt call urology to see if they would still like to see her given the pain as resolved. She verbalized and agreed to plan. She feels good movement. Follow up 2 wks with Melody.   Doreene Burke, CNM

## 2018-05-05 ENCOUNTER — Encounter: Payer: Self-pay | Admitting: *Deleted

## 2018-05-05 ENCOUNTER — Telehealth: Payer: Self-pay | Admitting: Obstetrics and Gynecology

## 2018-05-05 NOTE — Telephone Encounter (Signed)
Sent pt mychart message

## 2018-05-05 NOTE — Telephone Encounter (Signed)
The patient called and stated that she wants to know if she is able to have a physical in regards to her school requesting this to be done. Pt requesting a call back for clarification. Please advise.

## 2018-05-12 ENCOUNTER — Ambulatory Visit: Payer: Medicaid Other | Admitting: Urology

## 2018-05-15 ENCOUNTER — Telehealth: Payer: Self-pay | Admitting: Obstetrics and Gynecology

## 2018-05-15 ENCOUNTER — Encounter: Payer: Self-pay | Admitting: *Deleted

## 2018-05-15 NOTE — Telephone Encounter (Signed)
Done-ac 

## 2018-05-15 NOTE — Telephone Encounter (Signed)
Immunization records were faxed via Epic to [facility] at 4:09 PM today, per patient request, thanks.

## 2018-05-15 NOTE — Telephone Encounter (Signed)
The patient called and asked if she can get a TB Gold Plus done at her next OB appointment for her new job, and if we can send over her immunizations done here, and fax them to the number in her [facility] with her nurse, Amy, please advise, thanks.

## 2018-05-22 NOTE — Progress Notes (Signed)
Coronavirus (COVID-19) Are you at risk?  Are you at risk for the Coronavirus (COVID-19)?  To be considered HIGH RISK for Coronavirus (COVID-19), you have to meet the following criteria:  . Traveled to [place], [place], [place], [place] or [place]; or in the [place] to [place], [place], [place], or [place]; and have fever, cough, and shortness of breath within the last 2 weeks of travel OR . Been in close contact with a person diagnosed with COVID-19 within the last 2 weeks and have fever, cough, and shortness of breath . IF YOU DO NOT MEET THESE CRITERIA, YOU ARE CONSIDERED LOW RISK FOR COVID-19.  What to do if you are HIGH RISK for COVID-19?  Marland Kitchen If you are having a medical emergency, call 911. . Seek medical care right away. Before you go to a doctor's office, urgent care or emergency department, call ahead and tell them about your recent travel, contact with someone diagnosed with COVID-19, and your symptoms. You should receive instructions from your physician's office regarding next steps of care.  . When you arrive at healthcare provider, tell the healthcare staff immediately you have returned from visiting [place], [place], [place], [place] or [place]; or traveled in the [place] to [place], [place], [place], or [place]; in the last two weeks or you have been in close contact with a person diagnosed with COVID-19 in the last 2 weeks.   . Tell the health care staff about your symptoms: fever, cough and shortness of breath. . After you have been seen by a medical provider, you will be either: o Tested for (COVID-19) and discharged home on quarantine except to seek medical care if symptoms worsen, and asked to  - Stay home and avoid contact with others until you get your results (4-5 days)  - Avoid travel on public transportation if possible (such as bus, train, or airplane) or o Sent to the Emergency Department by EMS for evaluation, COVID-19 testing, and possible  admission depending on your condition and test results.  What to do if you are LOW RISK for COVID-19?  Reduce your risk of any infection by using the same precautions used for avoiding the common cold or flu:  Marland Kitchen Wash your hands often with soap and warm water for at least 20 seconds.  If soap and water are not readily available, use an alcohol-based hand sanitizer with at least 60% alcohol.  . If coughing or sneezing, cover your mouth and nose by coughing or sneezing into the elbow areas of your shirt or coat, into a tissue or into your sleeve (not your hands). . Avoid shaking hands with others and consider head nods or verbal greetings only. . Avoid touching your eyes, nose, or mouth with unwashed hands.  . Avoid close contact with people who are Shenice Dolder. . Avoid places or events with large numbers of people in one location, like concerts or sporting events. . Carefully consider travel plans you have or are making. . If you are planning any travel outside or inside the [place], visit the CDC's Travelers' Health webpage for the latest health notices. . If you have some symptoms but not all symptoms, continue to monitor at home and seek medical attention if your symptoms worsen. . If you are having a medical emergency, call 911. 05/22/18 screening negative sls  ADDITIONAL HEALTHCARE OPTIONS FOR PATIENTS  [place] [place] [facility] / e-Visit: [email or web address]         [facility]: [phone]  [facility]: [phone]                   [facility]: [phone]  

## 2018-05-23 ENCOUNTER — Encounter: Payer: Self-pay | Admitting: Certified Nurse Midwife

## 2018-05-23 ENCOUNTER — Other Ambulatory Visit: Payer: Self-pay

## 2018-05-23 ENCOUNTER — Ambulatory Visit (INDEPENDENT_AMBULATORY_CARE_PROVIDER_SITE_OTHER): Payer: Medicaid Other | Admitting: Certified Nurse Midwife

## 2018-05-23 VITALS — BP 103/53 | HR 80 | Wt 167.6 lb

## 2018-05-23 DIAGNOSIS — Z3403 Encounter for supervision of normal first pregnancy, third trimester: Secondary | ICD-10-CM

## 2018-05-23 LAB — POCT URINALYSIS DIPSTICK OB
Bilirubin, UA: NEGATIVE
Blood, UA: NEGATIVE
Glucose, UA: NEGATIVE
Ketones, UA: NEGATIVE
Leukocytes, UA: NEGATIVE
Nitrite, UA: NEGATIVE
POC,PROTEIN,UA: NEGATIVE
Spec Grav, UA: 1.01 (ref 1.010–1.025)
Urobilinogen, UA: 0.2 E.U./dL
pH, UA: 8 (ref 5.0–8.0)

## 2018-05-23 NOTE — Progress Notes (Signed)
ROB doing well. Did not see urologist. She states since the pain resolved she canceled the appointment. Feels good movement. Has occasional braxton hicks contractions. Discussed PTL precautions and GBS culture at next visit. She verbalizes and agrees to plan . Follow up 2 wks.   Doreene Burke, CNM

## 2018-05-23 NOTE — Patient Instructions (Signed)
Braxton Hicks Contractions Contractions of the uterus can occur throughout pregnancy, but they are not always a sign that you are in labor. You may have practice contractions called Braxton Hicks contractions. These false labor contractions are sometimes confused with true labor. What are Braxton Hicks contractions? Braxton Hicks contractions are tightening movements that occur in the muscles of the uterus before labor. Unlike true labor contractions, these contractions do not result in opening (dilation) and thinning of the cervix. Toward the end of pregnancy (32-34 weeks), Braxton Hicks contractions can happen more often and may become stronger. These contractions are sometimes difficult to tell apart from true labor because they can be very uncomfortable. You should not feel embarrassed if you go to the hospital with false labor. Sometimes, the only way to tell if you are in true labor is for your health care provider to look for changes in the cervix. The health care provider will do a physical exam and may monitor your contractions. If you are not in true labor, the exam should show that your cervix is not dilating and your water has not broken. If there are no other health problems associated with your pregnancy, it is completely safe for you to be sent home with false labor. You may continue to have Braxton Hicks contractions until you go into true labor. How to tell the difference between true labor and false labor True labor  Contractions last 30-70 seconds.  Contractions become very regular.  Discomfort is usually felt in the top of the uterus, and it spreads to the lower abdomen and low back.  Contractions do not go away with walking.  Contractions usually become more intense and increase in frequency.  The cervix dilates and gets thinner. False labor  Contractions are usually shorter and not as strong as true labor contractions.  Contractions are usually irregular.  Contractions  are often felt in the front of the lower abdomen and in the groin.  Contractions may go away when you walk around or change positions while lying down.  Contractions get weaker and are shorter-lasting as time goes on.  The cervix usually does not dilate or become thin. Follow these instructions at home:   Take over-the-counter and prescription medicines only as told by your health care provider.  Keep up with your usual exercises and follow other instructions from your health care provider.  Eat and drink lightly if you think you are going into labor.  If Braxton Hicks contractions are making you uncomfortable: ? Change your position from lying down or resting to walking, or change from walking to resting. ? Sit and rest in a tub of warm water. ? Drink enough fluid to keep your urine pale yellow. Dehydration may cause these contractions. ? Do slow and deep breathing several times an hour.  Keep all follow-up prenatal visits as told by your health care provider. This is important. Contact a health care provider if:  You have a fever.  You have continuous pain in your abdomen. Get help right away if:  Your contractions become stronger, more regular, and closer together.  You have fluid leaking or gushing from your vagina.  You pass blood-tinged mucus (bloody show).  You have bleeding from your vagina.  You have low back pain that you never had before.  You feel your baby's head pushing down and causing pelvic pressure.  Your baby is not moving inside you as much as it used to. Summary  Contractions that occur before labor are   called Braxton Hicks contractions, false labor, or practice contractions.  Braxton Hicks contractions are usually shorter, weaker, farther apart, and less regular than true labor contractions. True labor contractions usually become progressively stronger and regular, and they become more frequent.  Manage discomfort from Braxton Hicks contractions  by changing position, resting in a warm bath, drinking plenty of water, or practicing deep breathing. This information is not intended to replace advice given to you by your health care provider. Make sure you discuss any questions you have with your health care provider. Document Released: 06/24/2016 Document Revised: 11/23/2016 Document Reviewed: 06/24/2016 Elsevier Interactive Patient Education  2019 Elsevier Inc.  

## 2018-06-06 ENCOUNTER — Other Ambulatory Visit: Payer: Self-pay

## 2018-06-06 ENCOUNTER — Ambulatory Visit (INDEPENDENT_AMBULATORY_CARE_PROVIDER_SITE_OTHER): Payer: Medicaid Other | Admitting: Obstetrics and Gynecology

## 2018-06-06 VITALS — BP 111/64 | HR 94 | Wt 170.6 lb

## 2018-06-06 DIAGNOSIS — Z3403 Encounter for supervision of normal first pregnancy, third trimester: Secondary | ICD-10-CM

## 2018-06-06 LAB — POCT URINALYSIS DIPSTICK OB
Bilirubin, UA: NEGATIVE
Blood, UA: NEGATIVE
Glucose, UA: NEGATIVE
Ketones, UA: NEGATIVE
Leukocytes, UA: NEGATIVE
Nitrite, UA: NEGATIVE
POC,PROTEIN,UA: NEGATIVE
Spec Grav, UA: <=1.005 — AB (ref 1.010–1.025)
Urobilinogen, UA: 0.2 E.U./dL
pH, UA: 6.5 (ref 5.0–8.0)

## 2018-06-06 LAB — OB RESULTS CONSOLE GC/CHLAMYDIA: Gonorrhea: NEGATIVE

## 2018-06-06 LAB — OB RESULTS CONSOLE GBS: GBS: NEGATIVE

## 2018-06-06 NOTE — Progress Notes (Signed)
ROB- cultures obtained-labor precautions discussed.covid-19 restrictions discussed. Lives with spouse and he is working from home. 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

## 2018-06-08 LAB — GC/CHLAMYDIA PROBE AMP
Chlamydia trachomatis, NAA: NEGATIVE
Neisseria Gonorrhoeae by PCR: NEGATIVE

## 2018-06-10 LAB — STREP GP B CULTURE+RFLX: Strep Gp B Culture+Rflx: NEGATIVE

## 2018-06-15 ENCOUNTER — Other Ambulatory Visit: Payer: Self-pay | Admitting: Obstetrics and Gynecology

## 2018-06-15 DIAGNOSIS — L299 Pruritus, unspecified: Secondary | ICD-10-CM

## 2018-06-15 MED ORDER — HYDROXYZINE HCL 25 MG PO TABS: 25.0000 mg | ORAL_TABLET | Freq: Four times a day (QID) | ORAL | 2 refills | Status: DC | PRN

## 2018-06-16 ENCOUNTER — Other Ambulatory Visit: Payer: Self-pay

## 2018-06-16 ENCOUNTER — Other Ambulatory Visit: Payer: Medicaid Other

## 2018-06-16 DIAGNOSIS — L299 Pruritus, unspecified: Secondary | ICD-10-CM

## 2018-06-17 LAB — LIPASE: Lipase: 19 U/L (ref 14–72)

## 2018-06-17 LAB — URIC ACID: Uric Acid: 3.1 mg/dL (ref 2.5–7.1)

## 2018-06-17 LAB — AMYLASE: Amylase: 78 U/L (ref 31–110)

## 2018-06-26 ENCOUNTER — Telehealth: Payer: Self-pay

## 2018-06-26 NOTE — Telephone Encounter (Signed)
Coronavirus (COVID-19) Are you at risk?  Are you at risk for the Coronavirus (COVID-19)?  To be considered HIGH RISK for Coronavirus (COVID-19), you have to meet the following criteria:  . Traveled to [place], [place], [place], [place] or [place]; or in the [place] to [place], [place], [place], or [place]; and have fever, cough, and shortness of breath within the last 2 weeks of travel OR . Been in close contact with a person diagnosed with COVID-19 within the last 2 weeks and have fever, cough, and shortness of breath . IF YOU DO NOT MEET THESE CRITERIA, YOU ARE CONSIDERED LOW RISK FOR COVID-19.  What to do if you are HIGH RISK for COVID-19?  . If you are having a medical emergency, call 911. . Seek medical care right away. Before you go to a doctor's office, urgent care or emergency department, call ahead and tell them about your recent travel, contact with someone diagnosed with COVID-19, and your symptoms. You should receive instructions from your physician's office regarding next steps of care.  . When you arrive at healthcare provider, tell the healthcare staff immediately you have returned from visiting [place], [place], [place], [place] or [place]; or traveled in the [place] to [place], [place], [place], or [place]; in the last two weeks or you have been in close contact with a person diagnosed with COVID-19 in the last 2 weeks.   . Tell the health care staff about your symptoms: fever, cough and shortness of breath. . After you have been seen by a medical provider, you will be either: o Tested for (COVID-19) and discharged home on quarantine except to seek medical care if symptoms worsen, and asked to  - Stay home and avoid contact with others until you get your results (4-5 days)  - Avoid travel on public transportation if possible (such as bus, train, or airplane) or o Sent to the Emergency Department by EMS for evaluation, COVID-19 testing, and possible  admission depending on your condition and test results.  What to do if you are LOW RISK for COVID-19?  Reduce your risk of any infection by using the same precautions used for avoiding the common cold or flu:  . Wash your hands often with soap and warm water for at least 20 seconds.  If soap and water are not readily available, use an alcohol-based hand sanitizer with at least 60% alcohol.  . If coughing or sneezing, cover your mouth and nose by coughing or sneezing into the elbow areas of your shirt or coat, into a tissue or into your sleeve (not your hands). . Avoid shaking hands with others and consider head nods or verbal greetings only. . Avoid touching your eyes, nose, or mouth with unwashed hands.  . Avoid close contact with people who are Arneta Mahmood. . Avoid places or events with large numbers of people in one location, like concerts or sporting events. . Carefully consider travel plans you have or are making. . If you are planning any travel outside or inside the [place], visit the CDC's Travelers' Health webpage for the latest health notices. . If you have some symptoms but not all symptoms, continue to monitor at home and seek medical attention if your symptoms worsen. . If you are having a medical emergency, call 911.  06/26/18 SCREENING NEG SLS ADDITIONAL HEALTHCARE OPTIONS FOR PATIENTS  [facility] / e-Visit: [email or web address]         [facility]: [phone]    [facility]: [phone]                   [facility]: [phone]  

## 2018-06-27 ENCOUNTER — Ambulatory Visit (INDEPENDENT_AMBULATORY_CARE_PROVIDER_SITE_OTHER): Payer: Managed Care, Other (non HMO) | Admitting: Obstetrics and Gynecology

## 2018-06-27 ENCOUNTER — Other Ambulatory Visit: Payer: Self-pay

## 2018-06-27 VITALS — BP 118/66 | HR 78 | Wt 176.2 lb

## 2018-06-27 DIAGNOSIS — Z3493 Encounter for supervision of normal pregnancy, unspecified, third trimester: Secondary | ICD-10-CM

## 2018-06-27 LAB — POCT URINALYSIS DIPSTICK OB
Bilirubin, UA: NEGATIVE
Blood, UA: NEGATIVE
Glucose, UA: NEGATIVE
Ketones, UA: NEGATIVE
Leukocytes, UA: NEGATIVE
Nitrite, UA: NEGATIVE
POC,PROTEIN,UA: NEGATIVE
Spec Grav, UA: 1.01 (ref 1.010–1.025)
Urobilinogen, UA: 0.2 E.U./dL
pH, UA: 7 (ref 5.0–8.0)

## 2018-06-27 NOTE — Progress Notes (Signed)
ROB- pt is having a lot of pelvic pressure 

## 2018-06-27 NOTE — Progress Notes (Signed)
ROB- labor precautions discussed. Postdates care discussed. TB blood test obtained today.

## 2018-06-29 LAB — QUANTIFERON-TB GOLD PLUS
QuantiFERON Mitogen Value: >10 IU/mL
QuantiFERON Nil Value: 0.01 IU/mL
QuantiFERON TB1 Ag Value: 0.01 IU/mL
QuantiFERON TB2 Ag Value: 0.01 IU/mL
QuantiFERON-TB Gold Plus: NEGATIVE

## 2018-07-05 ENCOUNTER — Telehealth: Payer: Self-pay

## 2018-07-05 NOTE — Telephone Encounter (Signed)
Coronavirus (COVID-19) Are you at risk?  Are you at risk for the Coronavirus (COVID-19)?  To be considered HIGH RISK for Coronavirus (COVID-19), you have to meet the following criteria:  . Traveled to [place], [place], [place], [place] or [place]; or in the [place] to [place], [place], [place], or [place]; and have fever, cough, and shortness of breath within the last 2 weeks of travel OR . Been in close contact with a person diagnosed with COVID-19 within the last 2 weeks and have fever, cough, and shortness of breath . IF YOU DO NOT MEET THESE CRITERIA, YOU ARE CONSIDERED LOW RISK FOR COVID-19.  What to do if you are HIGH RISK for COVID-19?  . If you are having a medical emergency, call 911. . Seek medical care right away. Before you go to a doctor's office, urgent care or emergency department, call ahead and tell them about your recent travel, contact with someone diagnosed with COVID-19, and your symptoms. You should receive instructions from your physician's office regarding next steps of care.  . When you arrive at healthcare provider, tell the healthcare staff immediately you have returned from visiting [place], [place], [place], [place] or [place]; or traveled in the [place] to [place], [place], [place], or [place]; in the last two weeks or you have been in close contact with a person diagnosed with COVID-19 in the last 2 weeks.   . Tell the health care staff about your symptoms: fever, cough and shortness of breath. . After you have been seen by a medical provider, you will be either: o Tested for (COVID-19) and discharged home on quarantine except to seek medical care if symptoms worsen, and asked to  - Stay home and avoid contact with others until you get your results (4-5 days)  - Avoid travel on public transportation if possible (such as bus, train, or airplane) or o Sent to the Emergency Department by EMS for evaluation, COVID-19 testing, and possible  admission depending on your condition and test results.  What to do if you are LOW RISK for COVID-19?  Reduce your risk of any infection by using the same precautions used for avoiding the common cold or flu:  . Wash your hands often with soap and warm water for at least 20 seconds.  If soap and water are not readily available, use an alcohol-based hand sanitizer with at least 60% alcohol.  . If coughing or sneezing, cover your mouth and nose by coughing or sneezing into the elbow areas of your shirt or coat, into a tissue or into your sleeve (not your hands). . Avoid shaking hands with others and consider head nods or verbal greetings only. . Avoid touching your eyes, nose, or mouth with unwashed hands.  . Avoid close contact with people who are sick. . Avoid places or events with large numbers of people in one location, like concerts or sporting events. . Carefully consider travel plans you have or are making. . If you are planning any travel outside or inside the [place], visit the CDC's Travelers' Health webpage for the latest health notices. . If you have some symptoms but not all symptoms, continue to monitor at home and seek medical attention if your symptoms worsen. . If you are having a medical emergency, call 911.   ADDITIONAL HEALTHCARE OPTIONS FOR PATIENTS  [facility] / e-Visit: [email or web address]         [facility]: [phone]  [facility]: [phone]                   [facility]: [phone]   Prescreened. Neg .cm 

## 2018-07-06 ENCOUNTER — Other Ambulatory Visit: Payer: Self-pay

## 2018-07-06 ENCOUNTER — Ambulatory Visit (INDEPENDENT_AMBULATORY_CARE_PROVIDER_SITE_OTHER): Payer: Managed Care, Other (non HMO) | Admitting: Certified Nurse Midwife

## 2018-07-06 ENCOUNTER — Telehealth: Payer: Self-pay | Admitting: Certified Nurse Midwife

## 2018-07-06 ENCOUNTER — Other Ambulatory Visit: Payer: Self-pay | Admitting: Obstetrics and Gynecology

## 2018-07-06 ENCOUNTER — Ambulatory Visit (INDEPENDENT_AMBULATORY_CARE_PROVIDER_SITE_OTHER): Payer: Managed Care, Other (non HMO)

## 2018-07-06 VITALS — BP 115/70 | HR 85 | Wt 175.6 lb

## 2018-07-06 DIAGNOSIS — Z3493 Encounter for supervision of normal pregnancy, unspecified, third trimester: Secondary | ICD-10-CM

## 2018-07-06 DIAGNOSIS — O480 Post-term pregnancy: Secondary | ICD-10-CM

## 2018-07-06 DIAGNOSIS — Z3A40 40 weeks gestation of pregnancy: Secondary | ICD-10-CM

## 2018-07-06 LAB — POCT URINALYSIS DIPSTICK OB
Bilirubin, UA: NEGATIVE
Blood, UA: NEGATIVE
Glucose, UA: NEGATIVE
Ketones, UA: NEGATIVE
Leukocytes, UA: NEGATIVE
Nitrite, UA: NEGATIVE
POC,PROTEIN,UA: NEGATIVE
Spec Grav, UA: <=1.005 — AB (ref 1.010–1.025)
Urobilinogen, UA: 0.2 E.U./dL
pH, UA: 7.5 (ref 5.0–8.0)

## 2018-07-06 NOTE — Telephone Encounter (Signed)
The patients mother called and stated that she needs to speak with Marcelino Duster as soon as possible. Please advise.

## 2018-07-06 NOTE — Patient Instructions (Signed)
Nonstress Test A nonstress test is a procedure that is done during pregnancy in order to check the baby's heartbeat. The procedure can help show if the baby (fetus) is healthy. It is commonly done when:  The baby is past his or her due date.  The pregnancy is high risk.  The baby is moving less than normal.  The mother has lost a pregnancy in the past.  The health care provider suspects a problem with the baby's growth.  There is too much or too little amniotic fluid. The procedure is often done in the third trimester of pregnancy to find out if an early delivery is needed and whether such a delivery is safe. During a nonstress test, the baby's heartbeat is monitored when the baby is resting and when the baby is moving. If the baby is healthy, the heart rate will increase when he or she moves or kicks and will return to normal when he or she rests. Tell a health care provider about:  Any allergies you have.  Any medical conditions you have.  All medicines you are taking, including vitamins, herbs, eye drops, creams, and over-the-counter medicines. What are the risks? There are no risks to you or your baby from a nonstress test. This procedure should not be painful or uncomfortable. What happens before the procedure?  Eat a meal right before the test or as directed by your health care provider. Food may help encourage the baby to move.  Use the restroom right before the test. What happens during the procedure?  Two monitors will be placed on your abdomen. One will record the baby's heart rate and the other will record the contractions of your uterus.  You may be asked to lie down on your side or to sit upright.  You may be given a button to press when you feel your baby move.  Your health care provider will listen to your baby's heartbeat and recorded it. He or she may also watch your baby's heartbeat on a screen.  If the baby seems to be sleeping, you may be asked to drink  some juice or soda, eat a snack, or change positions. The procedure may vary among health care providers and hospitals. What happens after the procedure?  Your health care provider will discuss the test results with you and make recommendations for the future. Depending on the results, your health care provider may order additional tests or another course of action.  If your health care provider gave you any diet or activity instructions, make sure to follow them.  Keep all follow-up visits as told by your health care provider. This is important. Summary  A nonstress test is a procedure that is done during pregnancy in order to check the baby's heartbeat. The procedure can help show if the baby is healthy.  The procedure is often done in the third trimester of pregnancy to find out if an early delivery is needed and whether such a delivery is safe.  During a nonstress test, the baby's heartbeat is monitored when the baby is resting and when the baby is moving. If the baby is healthy, the heart rate will increase when he or she moves or kicks and will return to normal when he or she rests.  Your health care provider will discuss the test results with you and make recommendations for the future. This information is not intended to replace advice given to you by your health care provider. Make sure you discuss any   questions you have with your health care provider. Document Released: 01/29/2002 Document Revised: 05/20/2016 Document Reviewed: 05/20/2016 Elsevier Interactive Patient Education  2019 [employer]. Fetal Movement Counts Patient Name: ________________________________________________ Patient Due Date: ____________________ What is a fetal movement count?  A fetal movement count is the number of times that you feel your baby move during a certain amount of time. This may also be called a fetal kick count. A fetal movement count is recommended for every pregnant woman. You may be asked  to start counting fetal movements as early as week 28 of your pregnancy. Pay attention to when your baby is most active. You may notice your baby's sleep and wake cycles. You may also notice things that make your baby move more. You should do a fetal movement count:  When your baby is normally most active.  At the same time each day. A good time to count movements is while you are resting, after having something to eat and drink. How do I count fetal movements? 1. Find a quiet, comfortable area. Sit, or lie down on your side. 2. Write down the date, the start time and stop time, and the number of movements that you felt between those two times. Take this information with you to your health care visits. 3. For 2 hours, count kicks, flutters, swishes, rolls, and jabs. You should feel at least 10 movements during 2 hours. 4. You may stop counting after you have felt 10 movements. 5. If you do not feel 10 movements in 2 hours, have something to eat and drink. Then, keep resting and counting for 1 hour. If you feel at least 4 movements during that hour, you may stop counting. Contact a health care provider if:  You feel fewer than 4 movements in 2 hours.  Your baby is not moving like he or she usually does. Date: ____________ Start time: ____________ Stop time: ____________ Movements: ____________ Date: ____________ Start time: ____________ Stop time: ____________ Movements: ____________ Date: ____________ Start time: ____________ Stop time: ____________ Movements: ____________ Date: ____________ Start time: ____________ Stop time: ____________ Movements: ____________ Date: ____________ Start time: ____________ Stop time: ____________ Movements: ____________ Date: ____________ Start time: ____________ Stop time: ____________ Movements: ____________ Date: ____________ Start time: ____________ Stop time: ____________ Movements: ____________ Date: ____________ Start time: ____________ Stop time:  ____________ Movements: ____________ Date: ____________ Start time: ____________ Stop time: ____________ Movements: ____________ This information is not intended to replace advice given to you by your health care provider. Make sure you discuss any questions you have with your health care provider. Document Released: 03/10/2006 Document Revised: 10/08/2015 Document Reviewed: 03/20/2015 Elsevier Interactive Patient Education  2019 [employer]. Labor Induction  Labor induction is when steps are taken to cause a pregnant woman to begin the labor process. Most women go into labor on their own between 37 weeks and 42 weeks of pregnancy. When this does not happen or when there is a medical need for labor to begin, steps may be taken to induce labor. Labor induction causes a pregnant woman's uterus to contract. It also causes the cervix to soften (ripen), open (dilate), and thin out (efface). Usually, labor is not induced before 39 weeks of pregnancy unless there is a medical reason to do so. Your health care provider will determine if labor induction is needed. Before inducing labor, your health care provider will consider a number of factors, including:  Your medical condition and your baby's.  How many weeks along you are in your pregnancy.  How mature your baby's lungs are.  The condition of your cervix.  The position of your baby.  The size of your birth canal. What are some reasons for labor induction? Labor may be induced if:  Your health or your baby's health is at risk.  Your pregnancy is overdue by 1 week or more.  Your water breaks but labor does not start on its own.  There is a low amount of amniotic fluid around your baby. You may also choose (elect) to have labor induced at a certain time. Generally, elective labor induction is done no earlier than 39 weeks of pregnancy. What methods are used for labor induction? Methods used for labor induction include:  Prostaglandin  medicine. This medicine starts contractions and causes the cervix to dilate and ripen. It can be taken by mouth (orally) or by being inserted into the vagina (suppository).  Inserting a small, thin tube (catheter) with a balloon into the vagina and then expanding the balloon with water to dilate the cervix.  Stripping the membranes. In this method, your health care provider gently separates amniotic sac tissue from the cervix. This causes the cervix to stretch, which in turn causes the release of a hormone called progesterone. The hormone causes the uterus to contract. This procedure is often done during an office visit, after which you will be sent home to wait for contractions to begin.  Breaking the water. In this method, your health care provider uses a small instrument to make a small hole in the amniotic sac. This eventually causes the amniotic sac to break. Contractions should begin after a few hours.  Medicine to trigger or strengthen contractions. This medicine is given through an IV that is inserted into a vein in your arm. Except for membrane stripping, which can be done in a clinic, labor induction is done in the hospital so that you and your baby can be carefully monitored. How long does it take for labor to be induced? The length of time it takes to induce labor depends on how ready your body is for labor. Some inductions can take up to 2-3 days, while others may take less than a day. Induction may take longer if:  You are induced early in your pregnancy.  It is your first pregnancy.  Your cervix is not ready. What are some risks associated with labor induction? Some risks associated with labor induction include:  Changes in fetal heart rate, such as being too high, too low, or irregular (erratic).  Failed induction.  Infection in the mother or the baby.  Increased risk of having a cesarean delivery.  Fetal death.  Breaking off (abruption) of the placenta from the uterus  (rare).  Rupture of the uterus (very rare). When induction is needed for medical reasons, the benefits of induction generally outweigh the risks. What are some reasons for not inducing labor? Labor induction should not be done if:  Your baby does not tolerate contractions.  You have had previous surgeries on your uterus, such as a myomectomy, removal of fibroids, or a vertical scar from a previous cesarean delivery.  Your placenta lies very low in your uterus and blocks the opening of the cervix (placenta previa).  Your baby is not in a head-down position.  The umbilical cord drops down into the birth canal in front of the baby.  There are unusual circumstances, such as the baby being very early (premature).  You have had more than 2 previous cesarean deliveries. Summary  Labor induction is when steps are taken to cause a pregnant woman to begin the labor process.  Labor induction causes a pregnant woman's uterus to contract. It also causes the cervix to ripen, dilate, and efface.  Labor is not induced before 39 weeks of pregnancy unless there is a medical reason to do so.  When induction is needed for medical reasons, the benefits of induction generally outweigh the risks. This information is not intended to replace advice given to you by your health care provider. Make sure you discuss any questions you have with your health care provider. Document Released: 06/30/2006 Document Revised: 03/24/2016 Document Reviewed: 03/24/2016 Elsevier Interactive Patient Education  2019 [employer]

## 2018-07-06 NOTE — Progress Notes (Signed)
ROB-Reports intermittent periods of dizziness and lightedness. Discussed home treatment measures. BPP 8/8 with normal growth, see below. SVE unchanged from previous exam. 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 Herbal prep handout given. Anticipatory guidance regarding course of prenatal care. IOL scheduled 07/13/18 at 0500. RTC x Monday for NST and ROB or sooner if needed.  Preadmit testing IOL orders placed.   ULTRASOUND REPORT  Location: [facility] Date of Service: 07/06/2018   Indications: growth/ BPP Findings:  Mason Jim intrauterine pregnancy is visualized with FHR at 168 BPM. Biometrics give an (U/S) Gestational age of 104w6d and an (U/S) EDD of 07/14/2018; this correlates with the clinically established Estimated Date of Delivery: 07/05/18.  Fetal presentation is Cephalic.  Placenta: posterior. Grade: 2    BPP Scoring: Movement: 2/2  Tone: 2/2  Breathing: 2/2  AFI: 2/2 71 %  EFW: 3813g ( 8 lb 6 d )   Impression: 1. [redacted]w[redacted]d Viable Singleton Intrauterine pregnancy previously established criteria. 2. Growth is 71 %ile.  AFI is 16.8 cm.  3. BPP 8/8 Recommendations: 1.Clinical correlation with the patient's History and Physical Exam.

## 2018-07-07 ENCOUNTER — Other Ambulatory Visit: Payer: Self-pay

## 2018-07-07 ENCOUNTER — Observation Stay
Admission: AD | Admit: 2018-07-07 | Discharge: 2018-07-07 | Disposition: A | Discharge status: Home / Self Care | Payer: Managed Care, Other (non HMO) | Source: Home / Self Care | Attending: Obstetrics and Gynecology | Admitting: Obstetrics and Gynecology

## 2018-07-07 ENCOUNTER — Telehealth: Payer: Self-pay | Admitting: Certified Nurse Midwife

## 2018-07-07 DIAGNOSIS — R42 Dizziness and giddiness: Secondary | ICD-10-CM

## 2018-07-07 DIAGNOSIS — O480 Post-term pregnancy: Secondary | ICD-10-CM | POA: Diagnosis not present

## 2018-07-07 DIAGNOSIS — Z1159 Encounter for screening for other viral diseases: Secondary | ICD-10-CM | POA: Insufficient documentation

## 2018-07-07 DIAGNOSIS — Z881 Allergy status to other antibiotic agents status: Secondary | ICD-10-CM | POA: Insufficient documentation

## 2018-07-07 DIAGNOSIS — Z880 Allergy status to penicillin: Secondary | ICD-10-CM | POA: Insufficient documentation

## 2018-07-07 DIAGNOSIS — O26893 Other specified pregnancy related conditions, third trimester: Secondary | ICD-10-CM | POA: Diagnosis not present

## 2018-07-07 DIAGNOSIS — H538 Other visual disturbances: Secondary | ICD-10-CM | POA: Insufficient documentation

## 2018-07-07 DIAGNOSIS — Z3A40 40 weeks gestation of pregnancy: Secondary | ICD-10-CM | POA: Insufficient documentation

## 2018-07-07 DIAGNOSIS — H539 Unspecified visual disturbance: Secondary | ICD-10-CM

## 2018-07-07 LAB — CBC WITH DIFFERENTIAL/PLATELET
Abs Immature Granulocytes: 0.05 10*3/uL (ref 0.00–0.07)
Basophils Absolute: 0 10*3/uL (ref 0.0–0.1)
Basophils Relative: 0 %
Eosinophils Absolute: 0.1 10*3/uL (ref 0.0–0.5)
Eosinophils Relative: 1 %
HCT: 30.6 % — ABNORMAL LOW (ref 36.0–46.0)
Hemoglobin: 10.6 g/dL — ABNORMAL LOW (ref 12.0–15.0)
Immature Granulocytes: 1 %
Lymphocytes Relative: 16 %
Lymphs Abs: 1.5 10*3/uL (ref 0.7–4.0)
MCH: 32.6 pg (ref 26.0–34.0)
MCHC: 34.6 g/dL (ref 30.0–36.0)
MCV: 94.2 fL (ref 80.0–100.0)
Monocytes Absolute: 0.6 10*3/uL (ref 0.1–1.0)
Monocytes Relative: 7 %
Neutro Abs: 7 10*3/uL (ref 1.7–7.7)
Neutrophils Relative %: 75 %
Platelets: 163 10*3/uL (ref 150–400)
RBC: 3.25 MIL/uL — ABNORMAL LOW (ref 3.87–5.11)
RDW: 12.3 % (ref 11.5–15.5)
WBC: 9.3 10*3/uL (ref 4.0–10.5)
nRBC: 0 % (ref 0.0–0.2)

## 2018-07-07 LAB — COMPREHENSIVE METABOLIC PANEL
ALT: 12 U/L (ref 0–44)
AST: 19 U/L (ref 15–41)
Albumin: 2.7 g/dL — ABNORMAL LOW (ref 3.5–5.0)
Alkaline Phosphatase: 168 U/L — ABNORMAL HIGH (ref 38–126)
Anion gap: 8 (ref 5–15)
BUN: 9 mg/dL (ref 6–20)
CO2: 19 mmol/L — ABNORMAL LOW (ref 22–32)
Calcium: 8.4 mg/dL — ABNORMAL LOW (ref 8.9–10.3)
Chloride: 106 mmol/L (ref 98–111)
Creatinine, Ser: 0.52 mg/dL (ref 0.44–1.00)
GFR calc Af Amer: >60 mL/min (ref >60)
GFR calc non Af Amer: >60 mL/min (ref >60)
Glucose, Bld: 106 mg/dL — ABNORMAL HIGH (ref 70–99)
Potassium: 3.5 mmol/L (ref 3.5–5.1)
Sodium: 133 mmol/L — ABNORMAL LOW (ref 135–145)
Total Bilirubin: 0.5 mg/dL (ref 0.3–1.2)
Total Protein: 5.7 g/dL — ABNORMAL LOW (ref 6.5–8.1)

## 2018-07-07 LAB — SARS CORONAVIRUS 2 BY RT PCR (HOSPITAL ORDER, PERFORMED IN ~~LOC~~ HOSPITAL LAB): SARS Coronavirus 2: NEGATIVE

## 2018-07-07 LAB — SAMPLE TO BLOOD BANK

## 2018-07-07 LAB — PROTEIN / CREATININE RATIO, URINE
Creatinine, Urine: 29 mg/dL
Total Protein, Urine: <6 mg/dL

## 2018-07-07 NOTE — Telephone Encounter (Signed)
Pt is going to hospital for labs

## 2018-07-07 NOTE — Telephone Encounter (Signed)
The patient called and stated that she needs to speak with a midwife today. The patient stated that she call several times yesterday and was not successful with speaking with someone. Please advise.   Patient would like to be contacted at [phone]

## 2018-07-07 NOTE — OB Triage Provider Note (Signed)
Holly Tate is a 28 y.o. G1P0 at [redacted]w[redacted]d who is admitted for evaluation of dizziness, visual changes intermittent and in general not feeling well.  Estimated Date of Delivery: 07/05/18 Fetal presentation is cephalic.  Length of Stay:  0 Days. Admitted 07/07/2018  Subjective: Reports daily dizzy spells and visual changes with blurred vision and spots in visual field. None now. In general doesn't feel well. Denies fever or illness. States itching has improved a lot in the last week. Patient reports good fetal movement.  She reports no known uterine contractions, no bleeding and no loss of fluid per vagina.  Vitals:  Blood pressure 114/77, pulse 88, temperature 98.5 F (36.9 C), temperature source Oral, resp. rate 16, height 5' 5.5" (1.664 m), weight 79.8 kg, last menstrual period 09/28/2017. Physical Examination: CONSTITUTIONAL: Well-developed, well-nourished female in no acute distress.  SKIN: Skin is warm and dry. No rash noted. Not diaphoretic. No erythema. No pallor. NEUROLGIC: Alert and oriented to person, place, and time. Normal reflexes, muscle tone coordination. No cranial nerve deficit noted.PEARRL PSYCHIATRIC: Normal mood and affect. Normal behavior. Normal judgment and thought content. CARDIOVASCULAR: Normal heart rate noted, regular rhythm RESPIRATORY: Effort and breath sounds normal, no problems with respiration noted MUSCULOSKELETAL: Normal range of motion. No edema and no tenderness. 2+ distal pulses. ABDOMEN: Soft, nontender, nondistended, gravid. CERVIX:  not checked on admission  Fetal monitoring: FHR: 144 bpm, Variability: moderate, Accelerations: Present, Decelerations: Absent  Uterine activity: irregular and mild contractions   Results for orders placed or performed in visit on 07/06/18 (from the past 48 hour(s))  POC Urinalysis Dipstick OB     Status: Abnormal   Collection Time: 07/06/18  2:39 PM  Result Value Ref Range   Color, UA yellow    Clarity, UA clear    Glucose, UA Negative Negative   Bilirubin, UA neg    Ketones, UA neg    Spec Grav, UA <=1.005 (A) 1.010 - 1.025   Blood, UA neg    pH, UA 7.5 5.0 - 8.0   POC,PROTEIN,UA Negative Negative, Trace, Small (1+), Moderate (2+), Large (3+), 4+   Urobilinogen, UA 0.2 0.2 or 1.0 E.U./dL   Nitrite, UA neg    Leukocytes, UA Negative Negative   Appearance     Odor      No results found.  Current scheduled medications   I have reviewed the patient's current medications.  ASSESSMENT: There are no active problems to display for this patient. dizziness, and visual changes in pregnancy  PLAN: Labs obtained, will follow up accordingly, NST done and reactive. Will hydrate and eat and if labs stable will discharge home. Does have orders for IOL 07/13/2018 if not delivered by then.  Continue routine antenatal   MELODY N SHAMBLEY, CNM [facility]

## 2018-07-07 NOTE — Progress Notes (Signed)
Pt swabbed for COVID test per orders from provider. Pt given discharge instructions and reviewed labor precautions and reasons to return to the hospital. Pt verbalized understanding and all questions answered.

## 2018-07-08 LAB — VITAMIN B12: Vitamin B-12: 213 pg/mL (ref 180–914)

## 2018-07-09 ENCOUNTER — Other Ambulatory Visit: Payer: Self-pay

## 2018-07-09 ENCOUNTER — Inpatient Hospital Stay
Admission: EM | Admit: 2018-07-09 | Discharge: 2018-07-11 | DRG: 806 | Disposition: A | Discharge status: Home / Self Care | Payer: Managed Care, Other (non HMO) | Attending: Obstetrics and Gynecology | Admitting: Obstetrics and Gynecology

## 2018-07-09 ENCOUNTER — Inpatient Hospital Stay: Payer: Managed Care, Other (non HMO) | Admitting: Anesthesiology

## 2018-07-09 DIAGNOSIS — O9989 Other specified diseases and conditions complicating pregnancy, childbirth and the puerperium: Secondary | ICD-10-CM | POA: Diagnosis present

## 2018-07-09 DIAGNOSIS — Z3490 Encounter for supervision of normal pregnancy, unspecified, unspecified trimester: Secondary | ICD-10-CM | POA: Diagnosis present

## 2018-07-09 DIAGNOSIS — Z3A40 40 weeks gestation of pregnancy: Secondary | ICD-10-CM | POA: Diagnosis not present

## 2018-07-09 DIAGNOSIS — N1330 Unspecified hydronephrosis: Secondary | ICD-10-CM | POA: Diagnosis present

## 2018-07-09 DIAGNOSIS — D62 Acute posthemorrhagic anemia: Secondary | ICD-10-CM | POA: Diagnosis not present

## 2018-07-09 DIAGNOSIS — O480 Post-term pregnancy: Principal | ICD-10-CM | POA: Diagnosis present

## 2018-07-09 DIAGNOSIS — O9081 Anemia of the puerperium: Secondary | ICD-10-CM | POA: Diagnosis not present

## 2018-07-09 DIAGNOSIS — Z1159 Encounter for screening for other viral diseases: Secondary | ICD-10-CM | POA: Diagnosis not present

## 2018-07-09 DIAGNOSIS — O26893 Other specified pregnancy related conditions, third trimester: Secondary | ICD-10-CM | POA: Diagnosis present

## 2018-07-09 LAB — CBC
HCT: 30.8 % — ABNORMAL LOW (ref 36.0–46.0)
Hemoglobin: 10.6 g/dL — ABNORMAL LOW (ref 12.0–15.0)
MCH: 32.4 pg (ref 26.0–34.0)
MCHC: 34.4 g/dL (ref 30.0–36.0)
MCV: 94.2 fL (ref 80.0–100.0)
Platelets: 167 10*3/uL (ref 150–400)
RBC: 3.27 MIL/uL — ABNORMAL LOW (ref 3.87–5.11)
RDW: 12.3 % (ref 11.5–15.5)
WBC: 9.8 10*3/uL (ref 4.0–10.5)
nRBC: 0 % (ref 0.0–0.2)

## 2018-07-09 LAB — TYPE AND SCREEN
ABO/RH(D): O POS
Antibody Screen: NEGATIVE

## 2018-07-09 MED ORDER — ONDANSETRON HCL 4 MG/2ML IJ SOLN
4.0000 mg | Freq: Four times a day (QID) | INTRAMUSCULAR | Status: DC | PRN
  Administered 2018-07-09: 4 mg via INTRAVENOUS
  Filled 2018-07-09: qty 2

## 2018-07-09 MED ORDER — AMMONIA AROMATIC IN INHA
RESPIRATORY_TRACT | Status: AC
  Filled 2018-07-09: qty 10

## 2018-07-09 MED ORDER — MISOPROSTOL 100 MCG PO TABS
50.0000 ug | ORAL_TABLET | ORAL | Status: DC
  Administered 2018-07-09 (×2): 50 ug via VAGINAL
  Filled 2018-07-09 (×6): qty 1

## 2018-07-09 MED ORDER — LACTATED RINGERS IV SOLN
500.0000 mL | INTRAVENOUS | Status: DC | PRN
  Administered 2018-07-09: 500 mL via INTRAVENOUS

## 2018-07-09 MED ORDER — OXYCODONE-ACETAMINOPHEN 5-325 MG PO TABS: 1.0000 | ORAL_TABLET | ORAL | Status: DC | PRN

## 2018-07-09 MED ORDER — OXYTOCIN 10 UNIT/ML IJ SOLN
INTRAMUSCULAR | Status: AC
  Filled 2018-07-09: qty 2

## 2018-07-09 MED ORDER — PHENYLEPHRINE 40 MCG/ML (10ML) SYRINGE FOR IV PUSH (FOR BLOOD PRESSURE SUPPORT)
80.0000 ug | PREFILLED_SYRINGE | INTRAVENOUS | Status: DC | PRN
  Filled 2018-07-09: qty 10

## 2018-07-09 MED ORDER — ACETAMINOPHEN 325 MG PO TABS: 650.0000 mg | ORAL_TABLET | ORAL | Status: DC | PRN

## 2018-07-09 MED ORDER — MISOPROSTOL 200 MCG PO TABS
ORAL_TABLET | ORAL | Status: AC
  Administered 2018-07-09: 02:00:00 50 ug via VAGINAL
  Filled 2018-07-09: qty 4

## 2018-07-09 MED ORDER — SOD CITRATE-CITRIC ACID 500-334 MG/5ML PO SOLN: 30.0000 mL | ORAL | Status: DC | PRN

## 2018-07-09 MED ORDER — EPHEDRINE 5 MG/ML INJ
10.0000 mg | INTRAVENOUS | Status: DC | PRN
  Filled 2018-07-09: qty 2

## 2018-07-09 MED ORDER — SODIUM CHLORIDE 0.9 % IV SOLN
INTRAVENOUS | Status: DC | PRN
  Administered 2018-07-09 (×2): 5 mL via EPIDURAL

## 2018-07-09 MED ORDER — LIDOCAINE HCL (PF) 1 % IJ SOLN
INTRAMUSCULAR | Status: DC | PRN
  Administered 2018-07-09: 4 mL via SUBCUTANEOUS

## 2018-07-09 MED ORDER — DIPHENHYDRAMINE HCL 50 MG/ML IJ SOLN: 12.5000 mg | INTRAMUSCULAR | Status: DC | PRN

## 2018-07-09 MED ORDER — OXYTOCIN 40 UNITS IN NORMAL SALINE INFUSION - SIMPLE MED
1.0000 m[IU]/min | INTRAVENOUS | Status: DC
  Administered 2018-07-09: 1 m[IU]/min via INTRAVENOUS
  Filled 2018-07-09: qty 1000

## 2018-07-09 MED ORDER — FENTANYL 2.5 MCG/ML W/ROPIVACAINE 0.15% IN NS 100 ML EPIDURAL (ARMC)
12.0000 mL/h | EPIDURAL | Status: DC
  Administered 2018-07-09: 12 mL/h via EPIDURAL
  Filled 2018-07-09: qty 100

## 2018-07-09 MED ORDER — BUTORPHANOL TARTRATE 1 MG/ML IJ SOLN
1.0000 mg | INTRAMUSCULAR | Status: DC | PRN
  Administered 2018-07-09: 14:00:00 1 mg via INTRAVENOUS
  Filled 2018-07-09: qty 1

## 2018-07-09 MED ORDER — TERBUTALINE SULFATE 1 MG/ML IJ SOLN: 0.2500 mg | Freq: Once | INTRAMUSCULAR | Status: DC | PRN

## 2018-07-09 MED ORDER — ZOLPIDEM TARTRATE 5 MG PO TABS: 5.0000 mg | ORAL_TABLET | Freq: Every evening | ORAL | Status: DC | PRN

## 2018-07-09 MED ORDER — OXYCODONE-ACETAMINOPHEN 5-325 MG PO TABS: 2.0000 | ORAL_TABLET | ORAL | Status: DC | PRN

## 2018-07-09 MED ORDER — LIDOCAINE-EPINEPHRINE (PF) 1.5 %-1:200000 IJ SOLN
INTRAMUSCULAR | Status: DC | PRN
  Administered 2018-07-09: 3 mL via EPIDURAL

## 2018-07-09 MED ORDER — LACTATED RINGERS IV SOLN: 500.0000 mL | Freq: Once | INTRAVENOUS | Status: DC

## 2018-07-09 MED ORDER — OXYTOCIN 40 UNITS IN NORMAL SALINE INFUSION - SIMPLE MED: 2.5000 [IU]/h | INTRAVENOUS | Status: DC

## 2018-07-09 MED ORDER — OXYTOCIN BOLUS FROM INFUSION
500.0000 mL | Freq: Once | INTRAVENOUS | Status: AC
  Administered 2018-07-09: 500 mL via INTRAVENOUS

## 2018-07-09 MED ORDER — HYDROXYZINE HCL 25 MG PO TABS
50.0000 mg | ORAL_TABLET | Freq: Four times a day (QID) | ORAL | Status: DC | PRN
  Filled 2018-07-09: qty 1

## 2018-07-09 MED ORDER — LIDOCAINE HCL (PF) 1 % IJ SOLN: 30.0000 mL | INTRAMUSCULAR | Status: DC | PRN

## 2018-07-09 MED ORDER — LIDOCAINE HCL (PF) 1 % IJ SOLN
INTRAMUSCULAR | Status: AC
  Filled 2018-07-09: qty 30

## 2018-07-09 MED ORDER — LACTATED RINGERS IV SOLN
INTRAVENOUS | Status: DC
  Administered 2018-07-09 (×2): via INTRAVENOUS

## 2018-07-09 NOTE — H&P (Signed)
Obstetric History and Physical  Melodie BouillonKristen Madkins is a 28 y.o. G1P0 with IUP at [id number] presenting with orders for elective IOL. Patient states she has been having  none contractions, none vaginal bleeding, intact membranes, with active fetal movement.    [facility]  Pregnancy complications or risks:moderate to severe hydronephrosis in pregnancy  Prenatal labs and studies: ABO, Rh: --/--/O POS (05/17 0120) Antibody: NEG (05/17 0120) Rubella: 3.40 (10/18 1335) RPR: Non Reactive (02/24 1119)  HBsAg: Negative (10/18 1335)  HIV: Non Reactive (10/18 1335)  GBS: negative 1 hr Glucola  normal Genetic screening normal Anatomy US normal  Past Medical History:  Diagnosis Date  . ADD (attention deficit disorder)     Past Surgical History:  Procedure Laterality Date  . NO PAST SURGERIES      OB History  Gravida Para Term Preterm AB Living  1            SAB TAB Ectopic Multiple Live Births               # Outcome Date GA Lbr Len/2nd Weight Sex Delivery Anes PTL Lv  1 Current             Social History   Socioeconomic History  . Marital status: Married    Spouse name: Not on file  . Number of children: Not on file  . Years of education: Not on file  . Highest education level: Not on file  Occupational History  . Not on file  Social Needs  . Financial resource strain: Not hard at all  . Food insecurity:    Worry: Never true    Inability: Never true  . Transportation needs:    Medical: No    Non-medical: No  Tobacco Use  . Smoking status: Never Smoker  . Smokeless tobacco: Never Used  Substance and Sexual Activity  . Alcohol use: Not Currently  . Drug use: Never  . Sexual activity: Yes    Birth control/protection: Condom  Lifestyle  . Physical activity:    Days per week: 3 days    Minutes per session: 20 min  . Stress: Not at all  Relationships  . Social connections:    Talks on phone: More than three times a week    Gets together: More  than three times a week    Attends religious service: Never    Active member of club or organization: No    Attends meetings of clubs or organizations: Never    Relationship status: Married  Other Topics Concern  . Not on file  Social History Narrative  . Not on file    Family History  Problem Relation Age of Onset  . Breast cancer Neg Hx   . Ovarian cancer Neg Hx   . Colon cancer Neg Hx     Medications Prior to Admission  Medication Sig Dispense Refill Last Dose  . hydrOXYzine (ATARAX/VISTARIL) 25 MG tablet Take 1 tablet (25 mg total) by mouth every 6 (six) hours as needed for itching. 30 tablet 2 07/08/2018 at Unknown time  . Prenatal Vit-Fe Fumarate-FA (PRENATAL MULTIVITAMIN) TABS tablet Take 1 tablet by mouth daily at 12 noon.   07/09/2018 at Unknown time    Allergies  Allergen Reactions  . Omnicef [Cefdinir] Rash  . Penicillins Swelling and Rash    Review of Systems: Negative except for what is mentioned in HPI.  Physical Exam: BP 129/84   Pulse 91   Temp 98.2  F (36.8 C) (Oral)   Resp 16   Ht 5' 5.5" (1.664 m)   Wt 79.4 kg   LMP 09/28/2017   BMI 28.68 kg/m  GENERAL: Well-developed, well-nourished female in no acute distress.  LUNGS: Clear to auscultation bilaterally.  HEART: Regular rate and rhythm. ABDOMEN: Soft, nontender, nondistended, gravid. EXTREMITIES: Nontender, no edema, 2+ distal pulses. Cervical Exam: Dilation: 1.5 Effacement (%): 80 Cervical Position: Posterior Station: -2 Presentation: Vertex Exam by:: Galen Manila CNM FHT:  Baseline rate 120 bpm   Variability moderate  Accelerations present   Decelerations none Contractions: Every 1-3 mins, mild to palpation, two doses of cytotec placed   Pertinent Labs/Studies:   Results for orders placed or performed during the hospital encounter of 07/09/18 (from the past 24 hour(s))  CBC     Status: Abnormal   Collection Time: 07/09/18  1:20 AM  Result Value Ref Range   WBC 9.8 4.0 - 10.5 K/uL   RBC  3.27 (L) 3.87 - 5.11 MIL/uL   Hemoglobin 10.6 (L) 12.0 - 15.0 g/dL   HCT 73.2 (L) 20.2 - 54.2 %   MCV 94.2 80.0 - 100.0 fL   MCH 32.4 26.0 - 34.0 pg   MCHC 34.4 30.0 - 36.0 g/dL   RDW 70.6 23.7 - 62.8 %   Platelets 167 150 - 400 K/uL   nRBC 0.0 0.0 - 0.2 %  Type and screen     Status: None   Collection Time: 07/09/18  1:20 AM  Result Value Ref Range   ABO/RH(D) O POS    Antibody Screen NEG    Sample Expiration      07/12/2018,2359 Performed at [facility], [address], [place], [place] [address]     Assessment : Cytlaly Norum is a 28 y.o. G1P0 at [redacted]w[redacted]d being admitted for induction of labor.  Plan: Labor: Expectant management.  Induction/Augmentation as needed, per protocol, is due for third dose cytotec but will wait for now and watch contraction pattern FWB: Reassuring fetal heart tracing.  GBS negative Delivery plan: Hopeful for vaginal delivery  Javin Nong, CNM [facility], [facility]

## 2018-07-09 NOTE — Progress Notes (Signed)
Shayan Mancil is a 28 y.o. G1P0 at [redacted]w[redacted]d by LMP admitted for induction of labor due to Post dates and Elective at term.  Subjective: Reports severe rectal pressure with contractions,   Objective: BP 118/75 (BP Location: Left Arm)   Pulse 84   Temp 98.2 F (36.8 C) (Oral)   Resp 16   Ht 5' 5.5" (1.664 m)   Wt 79.4 kg   LMP 09/28/2017   SpO2 100%   BMI 28.68 kg/m  I/O last 3 completed shifts: In: 698 [I.V.:698] Out: -  No intake/output data recorded.  FHT:  FHR: 119 bpm, variability: moderate,  accelerations:  Present,  decelerations:  Absent UC:   regular, every 2 minutes SVE:   Dilation: Lip/rim Effacement (%): 90, 100 Station: 0, -1 Exam by:: MBS  Labs: Lab Results  Component Value Date   WBC 9.8 07/09/2018   HGB 10.6 (L) 07/09/2018   HCT 30.8 (L) 07/09/2018   MCV 94.2 07/09/2018   PLT 167 07/09/2018    Assessment / Plan: Augmentation of labor, progressing well  Labor: Progressing normally Preeclampsia:  labs stable Fetal Wellbeing:  Category I Pain Control:  Epidural I/D:  n/a Anticipated MOD:  NSVD  Johaan Ryser N Murel Wigle 07/09/2018, 10:33 PM

## 2018-07-09 NOTE — Progress Notes (Signed)
Pt admitted for IOL. Pt denies leaking of fluid or vaginal bleeding. States positive fetal movement. External monitors applied and assessing. Initial FHR 130. VS WNL. Will continue to assess.

## 2018-07-09 NOTE — Anesthesia Preprocedure Evaluation (Signed)
Anesthesia Evaluation  Patient identified by MRN, date of birth, ID band Patient awake    Reviewed: Allergy & Precautions, H&P , NPO status , Patient's Chart, lab work & pertinent test results, reviewed documented beta blocker date and time   History of Anesthesia Complications Negative for: history of anesthetic complications  Airway Mallampati: I  TM Distance: >3 FB Neck ROM: full    Dental no notable dental hx.    Pulmonary neg pulmonary ROS,    Pulmonary exam normal        Cardiovascular Exercise Tolerance: Good negative cardio ROS Normal cardiovascular exam     Neuro/Psych PSYCHIATRIC DISORDERS negative neurological ROS     GI/Hepatic negative GI ROS, Neg liver ROS,   Endo/Other  negative endocrine ROS  Renal/GU Renal disease (kidney stone)  negative genitourinary   Musculoskeletal   Abdominal   Peds  Hematology negative hematology ROS (+)   Anesthesia Other Findings Past Medical History: No date: ADD (attention deficit disorder)   Reproductive/Obstetrics (+) Pregnancy                             Anesthesia Physical Anesthesia Plan  ASA: II  Anesthesia Plan: Epidural   Post-op Pain Management:    Induction:   PONV Risk Score and Plan:   Airway Management Planned:   Additional Equipment:   Intra-op Plan:   Post-operative Plan:   Informed Consent: I have reviewed the patients History and Physical, chart, labs and discussed the procedure including the risks, benefits and alternatives for the proposed anesthesia with the patient or authorized representative who has indicated his/her understanding and acceptance.     Dental Advisory Given  Plan Discussed with: Anesthesiologist, CRNA and Surgeon  Anesthesia Plan Comments:         Anesthesia Quick Evaluation

## 2018-07-09 NOTE — Anesthesia Procedure Notes (Signed)
Epidural Patient location during procedure: OB Start time: 07/09/2018 5:08 PM End time: 07/09/2018 5:19 PM  Staffing Anesthesiologist: Lenard Simmer, MD Performed: anesthesiologist   Preanesthetic Checklist Completed: patient identified, site marked, surgical consent, pre-op evaluation, timeout performed, IV checked, risks and benefits discussed and monitors and equipment checked  Epidural Patient position: sitting Prep: ChloraPrep Patient monitoring: heart rate, continuous pulse ox and blood pressure Approach: midline Location: L3-L4 Injection technique: LOR saline  Needle:  Needle type: Tuohy  Needle gauge: 17 G Needle length: 9 cm and 9 Needle insertion depth: 6 cm Catheter type: closed end flexible Catheter size: 19 Gauge Catheter at skin depth: 11 cm Test dose: negative and 1.5% lidocaine with Epi 1:200 K  Assessment Sensory level: T10 Events: blood not aspirated, injection not painful, no injection resistance, negative IV test and no paresthesia  Additional Notes 1st attempt Pt. Evaluated and documentation done after procedure finished. Patient identified. Risks/Benefits/Options discussed with patient including but not limited to bleeding, infection, nerve damage, paralysis, failed block, incomplete pain control, headache, blood pressure changes, nausea, vomiting, reactions to medication both or allergic, itching and postpartum back pain. Confirmed with bedside nurse the patient's most recent platelet count. Confirmed with patient that they are not currently taking any anticoagulation, have any bleeding history or any family history of bleeding disorders. Patient expressed understanding and wished to proceed. All questions were answered. Sterile technique was used throughout the entire procedure. Please see nursing notes for vital signs. Test dose was given through epidural catheter and negative prior to continuing to dose epidural or start infusion. Warning signs of high  block given to the patient including shortness of breath, tingling/numbness in hands, complete motor block, or any concerning symptoms with instructions to call for help. Patient was given instructions on fall risk and not to get out of bed. All questions and concerns addressed with instructions to call with any issues or inadequate analgesia.   Patient tolerated the insertion well without immediate complications.Reason for block:procedure for pain

## 2018-07-10 ENCOUNTER — Other Ambulatory Visit: Payer: Managed Care, Other (non HMO)

## 2018-07-10 ENCOUNTER — Encounter: Payer: Managed Care, Other (non HMO) | Admitting: Certified Nurse Midwife

## 2018-07-10 LAB — CBC
HCT: 26.9 % — ABNORMAL LOW (ref 36.0–46.0)
Hemoglobin: 9.3 g/dL — ABNORMAL LOW (ref 12.0–15.0)
MCH: 32.9 pg (ref 26.0–34.0)
MCHC: 34.6 g/dL (ref 30.0–36.0)
MCV: 95.1 fL (ref 80.0–100.0)
Platelets: 157 10*3/uL (ref 150–400)
RBC: 2.83 MIL/uL — ABNORMAL LOW (ref 3.87–5.11)
RDW: 12.4 % (ref 11.5–15.5)
WBC: 22.6 10*3/uL — ABNORMAL HIGH (ref 4.0–10.5)
nRBC: 0 % (ref 0.0–0.2)

## 2018-07-10 MED ORDER — PRENATAL MULTIVITAMIN CH
1.0000 | ORAL_TABLET | Freq: Every day | ORAL | Status: DC
  Administered 2018-07-10: 12:00:00 1 via ORAL
  Filled 2018-07-10: qty 1

## 2018-07-10 MED ORDER — IBUPROFEN 600 MG PO TABS
600.0000 mg | ORAL_TABLET | Freq: Four times a day (QID) | ORAL | Status: DC
  Administered 2018-07-10 – 2018-07-11 (×6): 600 mg via ORAL
  Filled 2018-07-10 (×5): qty 1

## 2018-07-10 MED ORDER — ACETAMINOPHEN 325 MG PO TABS
650.0000 mg | ORAL_TABLET | ORAL | Status: DC | PRN
  Administered 2018-07-10: 650 mg via ORAL
  Filled 2018-07-10: qty 2

## 2018-07-10 MED ORDER — WITCH HAZEL-GLYCERIN EX PADS
MEDICATED_PAD | CUTANEOUS | Status: AC
  Administered 2018-07-10: 1 via TOPICAL
  Filled 2018-07-10: qty 100

## 2018-07-10 MED ORDER — DIBUCAINE (PERIANAL) 1 % EX OINT
TOPICAL_OINTMENT | CUTANEOUS | Status: AC
  Administered 2018-07-10: 1 via RECTAL
  Filled 2018-07-10: qty 28

## 2018-07-10 MED ORDER — COCONUT OIL OIL
1.0000 "application " | TOPICAL_OIL | Status: DC | PRN
  Administered 2018-07-10: 1 via TOPICAL
  Filled 2018-07-10: qty 120

## 2018-07-10 MED ORDER — BENZOCAINE-MENTHOL 20-0.5 % EX AERO
INHALATION_SPRAY | CUTANEOUS | Status: AC
  Administered 2018-07-10: 1 via TOPICAL
  Filled 2018-07-10: qty 56

## 2018-07-10 MED ORDER — MAGNESIUM OXIDE 400 (241.3 MG) MG PO TABS
400.0000 mg | ORAL_TABLET | Freq: Every day | ORAL | Status: DC
  Administered 2018-07-11: 400 mg via ORAL
  Filled 2018-07-10 (×2): qty 1

## 2018-07-10 MED ORDER — DIBUCAINE (PERIANAL) 1 % EX OINT
1.0000 "application " | TOPICAL_OINTMENT | CUTANEOUS | Status: DC | PRN
  Administered 2018-07-10: 1 via RECTAL

## 2018-07-10 MED ORDER — IBUPROFEN 600 MG PO TABS
ORAL_TABLET | ORAL | Status: AC
  Administered 2018-07-10: 600 mg via ORAL
  Filled 2018-07-10: qty 1

## 2018-07-10 MED ORDER — WITCH HAZEL-GLYCERIN EX PADS
1.0000 "application " | MEDICATED_PAD | CUTANEOUS | Status: DC | PRN
  Administered 2018-07-10: 1 via TOPICAL

## 2018-07-10 MED ORDER — BENZOCAINE-MENTHOL 20-0.5 % EX AERO
1.0000 "application " | INHALATION_SPRAY | CUTANEOUS | Status: DC | PRN
  Administered 2018-07-10: 1 via TOPICAL

## 2018-07-10 MED ORDER — DOCUSATE SODIUM 100 MG PO CAPS
100.0000 mg | ORAL_CAPSULE | Freq: Two times a day (BID) | ORAL | Status: DC
  Administered 2018-07-10 – 2018-07-11 (×3): 100 mg via ORAL
  Filled 2018-07-10 (×3): qty 1

## 2018-07-10 MED ORDER — SIMETHICONE 80 MG PO CHEW: 80.0000 mg | CHEWABLE_TABLET | ORAL | Status: DC | PRN

## 2018-07-10 MED ORDER — ONDANSETRON HCL 4 MG PO TABS: 4.0000 mg | ORAL_TABLET | ORAL | Status: DC | PRN

## 2018-07-10 MED ORDER — FERROUS SULFATE 325 (65 FE) MG PO TABS
325.0000 mg | ORAL_TABLET | Freq: Two times a day (BID) | ORAL | Status: DC
  Administered 2018-07-10 – 2018-07-11 (×3): 325 mg via ORAL
  Filled 2018-07-10 (×3): qty 1

## 2018-07-10 MED ORDER — DIPHENHYDRAMINE HCL 25 MG PO CAPS: 25.0000 mg | ORAL_CAPSULE | Freq: Four times a day (QID) | ORAL | Status: DC | PRN

## 2018-07-10 MED ORDER — ONDANSETRON HCL 4 MG/2ML IJ SOLN: 4.0000 mg | INTRAMUSCULAR | Status: DC | PRN

## 2018-07-10 NOTE — Anesthesia Postprocedure Evaluation (Signed)
Anesthesia Post Note  Patient: Holly Tate  Procedure(s) Performed: AN AD HOC LABOR EPIDURAL  Patient location during evaluation: Mother Baby Anesthesia Type: Epidural Level of consciousness: awake and alert Pain management: pain level controlled Vital Signs Assessment: post-procedure vital signs reviewed and stable Respiratory status: spontaneous breathing, nonlabored ventilation and respiratory function stable Cardiovascular status: stable Postop Assessment: no headache, no backache and epidural receding Anesthetic complications: no     Last Vitals:  Vitals:   07/10/18 0157 07/10/18 0308  BP: 105/70 113/71  Pulse: 93 94  Resp:  20  Temp: 36.9 C 36.9 C  SpO2: 99% 100%    Last Pain:  Vitals:   07/10/18 0308  TempSrc: Oral  PainSc:                  Rica Mast

## 2018-07-10 NOTE — Progress Notes (Addendum)
Patient ID: Saleena Liegel, female   DOB: [birth year], 28 y.o.   MRN: [id number]  Post Partum Day # 1, s/p induced vaginal birth after elective induction at [place], Rh positive  Subjective:  Patient resting in bed with eyes closed. FOB and infant at bedside.   No difficulty breathing or respiratory distress, chest pain, abdominal pain, excessive vaginal bleeding, dysuria, and leg pain per nursing assessment.   Objective: Temp:  [97.8 F (36.6 C)-98.5 F (36.9 C)] 98 F (36.7 C) (05/18 1100) Pulse Rate:  [73-144] 95 (05/18 1100) Resp:  [16-20] 18 (05/18 1100) BP: (105-138)/(66-89) 109/68 (05/18 1100) SpO2:  [99 %-100 %] 100 % (05/18 0308)  Physical Exam: Deferred, patient sleeping.    Recent Labs    07/09/18 0120 07/10/18 0605  HGB 10.6* 9.3*  HCT 30.8* 26.9*    Assessment:  28 year old female, G1P1 Post Partum Day # 1, s/p induced vaginal birth after elective induction at [place], Rh positive, IDA anemia with blood loss anemia  Breastfeeding with formula supplementation  Plan:  Add magnesium daily to help with iron absorption and prevention of constipation.   Continue orders as written. Reassess as needed.   Plan for discharge tomorrow.   LOS: 1 day    Gunnar Bulla, CNM [facility], [facility] 07/10/2018 12:21 PM

## 2018-07-10 NOTE — Lactation Note (Signed)
This note was copied from a baby's chart. Lactation Consultation Note  Patient Name: Holly Tate [id number] Date: 07/10/2018 Reason for consult: Follow-up assessment;Mother's request;Difficult latch;Primapara;Term(Left nipple slightly inverts on compression)  [name] is arousing to feed licking her tongue out.  Pointed out feeding cues to parents.  Assisted mom in comfortable position in sidelying position on left breast.  With first assist this am with breast feeding, LC was able to obtain latch on right breast in football hold and left breast in sidelying position after several tries with sandwich compression of the breast.  She breast fed for 10 minutes on each breast with strong rhythmic sucking and occasional swallow.  [name] was more awake at the 11:17 am feeding than now.  Mom's right nipple is slightly flat.  Mom's left nipple inverts slightly with compression.  Since we were able to obtain latch earlier this am in sidelying position on slightly inverted nipple on the left side, we tried again in that position but without successful latch this time.  Tried #24 nipple shield, but seemed too big for [name]'s mouth and she kept pushing it out with her tongue.  #20 nipple shield was tight over nipple, but was able to get [name] to latch.  She took a few sucks, but then fell back to sleep and refused to continue to suck.  Bowel movement changed and put her back on the breast with #20 nipple shield, but she never did any more nutritive sucking.  Reviewed supply and demand, normal course of lactation and routine newborn feeding patterns.  Encouraged mom to put [name] to the breast with any demonstration of feeding cues.  Lactation name and number written on white board and encouraged to call with any questions, concerns or assistance. Maternal Data Formula Feeding for Exclusion: No Has patient been taught Hand Expression?: Yes(Several attempts for 1 to 2 drops of colostrum) Does the patient have  breastfeeding experience prior to this delivery?: No(Gr1)  Feeding Feeding Type: Breast Fed  LATCH Score Latch: Repeated attempts needed to sustain latch, nipple held in mouth throughout feeding, stimulation needed to elicit sucking reflex.  Audible Swallowing: None  Type of Nipple: Inverted  Comfort (Breast/Nipple): Soft / non-tender  Hold (Positioning): Full assist, staff holds infant at breast  LATCH Score: 3  Interventions Interventions: Assisted with latch;Breast massage;Hand express;Reverse pressure;Breast compression;Adjust position;Support pillows;Position options;Coconut oil  Lactation Tools Discussed/Used Tools: Coconut oil;Nipple Shields Nipple shield size: 20;24(#24 lg for [name]'s mouth & 20 tight on lt nipple) WIC Program: Yes(Mom also has [employer])   Consult Status Consult Status: Follow-up Date: 07/10/18 Follow-up type: Call as needed    Louis Meckel 07/10/2018, 4:42 PM

## 2018-07-11 LAB — RPR: RPR Ser Ql: NONREACTIVE

## 2018-07-11 MED ORDER — FERROUS SULFATE 325 (65 FE) MG PO TABS: 325.0000 mg | ORAL_TABLET | Freq: Two times a day (BID) | ORAL | 3 refills | Status: DC

## 2018-07-11 MED ORDER — IBUPROFEN 600 MG PO TABS: 600.0000 mg | ORAL_TABLET | Freq: Four times a day (QID) | ORAL | 0 refills | Status: DC

## 2018-07-11 NOTE — Discharge Instructions (Signed)
Home Care Instructions for Mom  Activity  · Gradually return to your regular activities.  · Let yourself rest. Nap while your baby sleeps.  · Avoid lifting anything that is heavier than 10 lb (4.5 kg) until your health care provider says it is okay.  · Avoid activities that take a lot of effort and energy (are strenuous) until approved by your health care provider. Walking at a slow-to-moderate pace is usually safe.  · If you had a cesarean delivery:  ? Do not vacuum, climb stairs, or drive a car for 4-6 weeks.  ? Have someone help you at home until you feel like you can do your usual activities yourself.  ? Do exercises as told by your health care provider, if this applies.  Vaginal bleeding  You may continue to bleed for 4-6 weeks after delivery. Over time, the amount of blood usually decreases and the color of the blood usually gets lighter. However, the flow of bright red blood may increase if you have been too active. If you need to use more than one pad in an hour because your pad gets soaked, or if you pass a large clot:  · Lie down.  · Raise your feet.  · Place a cold compress on your lower abdomen.  · Rest.  · Call your health care provider.  If you are breastfeeding, your period should return anytime between 8 weeks after delivery and the time that you stop breastfeeding. If you are not breastfeeding, your period should return 6-8 weeks after delivery.  Perineal care  The perineal area, or perineum, is the part of your body between your thighs. After delivery, this area needs special care. Follow these instructions as told by your health care provider.  · Take warm tub baths for 15-20 minutes.  · Use medicated pads and pain-relieving sprays and creams as told.  · Do not use tampons or douches until vaginal bleeding has stopped.  · Each time you go to the bathroom:  ? Use a peri bottle.  ? Change your pad.  ? Use towelettes in place of toilet paper until your stitches have healed.  · Do Kegel exercises  every day. Kegel exercises help to maintain the muscles that support the vagina, bladder, and bowels. You can do these exercises while you are standing, sitting, or lying down. To do Kegel exercises:  ? Tighten the muscles of your abdomen and the muscles that surround your birth canal.  ? Hold for a few seconds.  ? Relax.  ? Repeat until you have done this 5 times in a row.  · To prevent hemorrhoids from developing or getting worse:  ? Drink enough fluid to keep your urine clear or pale yellow.  ? Avoid straining when having a bowel movement.  ? Take over-the-counter medicines and stool softeners as told by your health care provider.  Breast care  · Wear a tight-fitting bra.  · Avoid taking over-the-counter pain medicine for breast discomfort.  · Apply ice to the breasts to help with discomfort as needed:  ? Put ice in a plastic bag.  ? Place a towel between your skin and the bag.  ? Leave the ice on for 20 minutes or as told by your health care provider.  Nutrition  · Eat a well-balanced diet.  · Do not try to lose weight quickly by cutting back on calories.  · Take your prenatal vitamins until your postpartum checkup or until your health care provider tells   you have postpartum depression, get support from your partner, friends, and family. If the depression does not go away on its own after several weeks, contact your health care provider. Breast self-exam  Do a breast self-exam each month, at the same time of the month. If you are breastfeeding, check your breasts just after a feeding, when your breasts are less full. If you are breastfeeding and your period has started, check your breasts on day 5, 6, or 7 of your  period. Report any lumps, bumps, or discharge to your health care provider. Know that breasts are normally lumpy if you are breastfeeding. This is temporary, and it is not a health risk. Intimacy and sexuality Avoid sexual activity for at least 3-4 weeks after delivery or until the brownish-red vaginal flow is completely gone. If you want to avoid pregnancy, use some form of birth control. You can get pregnant after delivery, even if you have not had your period. Contact a health care provider if:  You feel unable to cope with the changes that a child brings to your life, and these feelings do not go away after several weeks.  You notice a lump, a bump, or discharge on your breast. Get help right away if:  Blood soaks your pad in 1 hour or less.  You have: ? Severe pain or cramping in your lower abdomen. ? A bad-smelling vaginal discharge. ? A fever that is not controlled by medicine. ? A fever, and an area of your breast is red and sore. ? Pain or redness in your calf. ? Sudden, severe chest pain. ? Shortness of breath. ? Painful or bloody urination. ? Problems with your vision. ? You vomit for 12 hours or longer. ? You develop a severe headache. ? You have serious thoughts about hurting yourself, your child, or anyone else. This information is not intended to replace advice given to you by your health care provider. Make sure you discuss any questions you have with your health care provider. Document Released: 02/06/2000 Document Revised: 04/06/2017 Document Reviewed: 08/12/2014 Elsevier Interactive Patient Education  2019 [employer] Postpartum Care After Vaginal Delivery This sheet gives you information about how to care for yourself from the time you deliver your baby to up to 6-12 weeks after delivery (postpartum period). Your health care provider may also give you more specific instructions. If you have problems or questions, contact your health care provider. Follow these  instructions at home: Vaginal bleeding  It is normal to have vaginal bleeding (lochia) after delivery. Wear a sanitary pad for vaginal bleeding and discharge. ? During the first week after delivery, the amount and appearance of lochia is often similar to a menstrual period. ? Over the next few weeks, it will gradually decrease to a dry, yellow-brown discharge. ? For most women, lochia stops completely by 4-6 weeks after delivery. Vaginal bleeding can vary from woman to woman.  Change your sanitary pads frequently. Watch for any changes in your flow, such as: ? A sudden increase in volume. ? A change in color. ? Large blood clots.  If you pass a blood clot from your vagina, save it and call your health care provider to discuss. Do not flush blood clots down the toilet before talking with your health care provider.  Do not use tampons or douches until your health care provider says this is safe.  If you are not breastfeeding, your period should return 6-8 weeks after delivery. If you are feeding your child breast  milk only (exclusive breastfeeding), your period may not return until you stop breastfeeding. Perineal care  Keep the area between the vagina and the anus (perineum) clean and dry as told by your health care provider. Use medicated pads and pain-relieving sprays and creams as directed.  If you had a cut in the perineum (episiotomy) or a tear in the vagina, check the area for signs of infection until you are healed. Check for: ? More redness, swelling, or pain. ? Fluid or blood coming from the cut or tear. ? Warmth. ? Pus or a bad smell.  You may be given a squirt bottle to use instead of wiping to clean the perineum area after you go to the bathroom. As you start healing, you may use the squirt bottle before wiping yourself. Make sure to wipe gently.  To relieve pain caused by an episiotomy, a tear in the vagina, or swollen veins in the anus (hemorrhoids), try taking a warm sitz  bath 2-3 times a day. A sitz bath is a warm water bath that is taken while you are sitting down. The water should only come up to your hips and should cover your buttocks. Breast care  Within the first few days after delivery, your breasts may feel heavy, full, and uncomfortable (breast engorgement). Milk may also leak from your breasts. Your health care provider can suggest ways to help relieve the discomfort. Breast engorgement should go away within a few days.  If you are breastfeeding: ? Wear a bra that supports your breasts and fits you well. ? Keep your nipples clean and dry. Apply creams and ointments as told by your health care provider. ? You may need to use breast pads to absorb milk that leaks from your breasts. ? You may have uterine contractions every time you breastfeed for up to several weeks after delivery. Uterine contractions help your uterus return to its normal size. ? If you have any problems with breastfeeding, work with your health care provider or Ad[employer] copywriter.  If you are not breastfeeding: ? Avoid touching your breasts a lot. Doing this can make your breasts produce more milk. ? Wear a good-fitting bra and use cold packs to help with swelling. ? Do not squeeze out (express) milk. This causes you to make more milk. Intimacy and sexuality  Ask your health care provider when you can engage in sexual activity. This may depend on: ? Your risk of infection. ? How fast you are healing. ? Your comfort and desire to engage in sexual activity.  You are able to get pregnant after delivery, even if you have not had your period. If desired, talk with your health care provider about methods of birth control (contraception). Medicines  Take over-the-counter and prescription medicines only as told by your health care provider.  If you were prescribed an antibiotic medicine, take it as told by your health care provider. Do not stop taking the antibiotic even if you start  to feel better. Activity  Gradually return to your normal activities as told by your health care provider. Ask your health care provider what activities are safe for you.  Rest as much as possible. Try to rest or take a nap while your baby is sleeping. Eating and drinking   Drink enough fluid to keep your urine pale yellow.  Eat high-fiber foods every day. These may help prevent or relieve constipation. High-fiber foods include: ? Whole grain cereals and breads. ? Brown rice. ? Beans. ? Fresh fruits  and vegetables.  Do not try to lose weight quickly by cutting back on calories.  Take your prenatal vitamins until your postpartum checkup or until your health care provider tells you it is okay to stop. Lifestyle  Do not use any products that contain nicotine or tobacco, such as cigarettes and e-cigarettes. If you need help quitting, ask your health care provider.  Do not drink alcohol, especially if you are breastfeeding. General instructions  Keep all follow-up visits for you and your baby as told by your health care provider. Most women visit their health care provider for a postpartum checkup within the first 3-6 weeks after delivery. Contact a health care provider if:  You feel unable to cope with the changes that your child brings to your life, and these feelings do not go away.  You feel unusually sad or worried.  Your breasts become red, painful, or hard.  You have a fever.  You have trouble holding urine or keeping urine from leaking.  You have little or no interest in activities you used to enjoy.  You have not breastfed at all and you have not had a menstrual period for 12 weeks after delivery.  You have stopped breastfeeding and you have not had a menstrual period for 12 weeks after you stopped breastfeeding.  You have questions about caring for yourself or your baby.  You pass a blood clot from your vagina. Get help right away if:  You have chest  pain.  You have difficulty breathing.  You have sudden, severe leg pain.  You have severe pain or cramping in your lower abdomen.  You bleed from your vagina so much that you fill more than one sanitary pad in one hour. Bleeding should not be heavier than your heaviest period.  You develop a severe headache.  You faint.  You have blurred vision or spots in your vision.  You have bad-smelling vaginal discharge.  You have thoughts about hurting yourself or your baby. If you ever feel like you may hurt yourself or others, or have thoughts about taking your own life, get help right away. You can go to the nearest emergency department or call:  Your local emergency services (911 in the [place]  A suicide crisis helpline, such as the National Suicide Prevention Lifeline at (248)385-08431-(403) 609-6682. This is open 24 hours a day. Summary  The period of time right after you deliver your newborn up to 6-12 weeks after delivery is called the postpartum period.  Gradually return to your normal activities as told by your health care provider.  Keep all follow-up visits for you and your baby as told by your health care provider. This information is not intended to replace advice given to you by your health care provider. Make sure you discuss any questions you have with your health care provider. Document Released: 12/06/2006 Document Revised: 11/22/2016 Document Reviewed: 11/22/2016 Elsevier Interactive Patient Education  2019 [employer] Postpartum Baby Blues The postpartum period begins right after the birth of a baby. During this time, there is often a lot of joy and excitement. It is also a time of many changes in the life of the parents. No matter how many times a mother gives birth, each child brings new challenges to the family, including different ways of relating to one another. It is common to have feelings of excitement along with confusing changes in moods, emotions, and thoughts. You may  feel happy one minute and sad or stressed the next. These  feelings of sadness usually happen in the period right after you have your baby, and they go away within a week or two. This is called the "baby blues." What are the causes? There is no known cause of baby blues. It is likely caused by a combination of factors. However, changes in hormone levels after childbirth are believed to trigger some of the symptoms. Other factors that can play a role in these mood changes include:  Lack of sleep.  Stressful life events, such as poverty, caring for a loved one, or death of a loved one.  Genetics. What are the signs or symptoms? Symptoms of this condition include:  Brief changes in mood, such as going from extreme happiness to sadness.  Decreased concentration.  Difficulty sleeping.  Crying spells and tearfulness.  Loss of appetite.  Irritability.  Anxiety. If the symptoms of baby blues last for more than 2 weeks or become more severe, you may have postpartum depression. How is this diagnosed? This condition is diagnosed based on an evaluation of your symptoms. There are no medical or lab tests that lead to a diagnosis, but there are various questionnaires that a health care provider may use to identify women with the baby blues or postpartum depression. How is this treated? Treatment is not needed for this condition. The baby blues usually go away on their own in 1-2 weeks. Social support is often all that is needed. You will be encouraged to get adequate sleep and rest. Follow these instructions at home: Lifestyle      Get as much rest as you can. Take a nap when the baby sleeps.  Exercise regularly as told by your health care provider. Some women find yoga and walking to be helpful.  Eat a balanced and nourishing diet. This includes plenty of fruits and vegetables, whole grains, and lean proteins.  Do little things that you enjoy. Have a cup of tea, take a bubble bath, read  your favorite magazine, or listen to your favorite music.  Avoid alcohol.  Ask for help with household chores, cooking, grocery shopping, or running errands. Do not try to do everything yourself. Consider hiring a postpartum doula to help. This is a professional who specializes in providing support to new mothers.  Try not to make any major life changes during pregnancy or right after giving birth. This can add stress. General instructions  Talk to people close to you about how you are feeling. Get support from your partner, family members, friends, or other new moms. You may want to join a support group.  Find ways to cope with stress. This may include: ? Writing your thoughts and feelings in a journal. ? Spending time outside. ? Spending time with people who make you laugh.  Try to stay positive in how you think. Think about the things you are grateful for.  Take over-the-counter and prescription medicines only as told by your health care provider.  Let your health care provider know if you have any concerns.  Keep all postpartum visits as told by your health care provider. This is important. Contact a health care provider if:  Your baby blues do not go away after 2 weeks. Get help right away if:  You have thoughts of taking your own life (suicidal thoughts).  You think you may harm the baby or other people.  You see or hear things that are not there (hallucinations). Summary  After giving birth, you may feel happy one minute and sad or  stressed the next. Feelings of sadness that happen right after the baby is born and go away after a week or two are called the "baby blues."  You can manage the baby blues by getting enough rest, eating a healthy diet, exercising, spending time with supportive people, and finding ways to cope with stress.  If feelings of sadness and stress last longer than 2 weeks or get in the way of caring for your baby, talk to your health care provider.  This may mean you have postpartum depression. This information is not intended to replace advice given to you by your health care provider. Make sure you discuss any questions you have with your health care provider. Document Released: 11/13/2003 Document Revised: 04/06/2016 Document Reviewed: 04/06/2016 Elsevier Interactive Patient Education  2019 [employer]. Breastfeeding  Choosing to breastfeed is one of the best decisions you can make for yourself and your baby. A change in hormones during pregnancy causes your breasts to make breast milk in your milk-producing glands. Hormones prevent breast milk from being released before your baby is born. They also prompt milk flow after birth. Once breastfeeding has begun, thoughts of your baby, as well as his or her sucking or crying, can stimulate the release of milk from your milk-producing glands. Benefits of breastfeeding Research shows that breastfeeding offers many health benefits for infants and mothers. It also offers a cost-free and convenient way to feed your baby. For your baby  Your first milk (colostrum) helps your baby's digestive system to function better.  Special cells in your milk (antibodies) help your baby to fight off infections.  Breastfed babies are less likely to develop asthma, allergies, obesity, or type 2 diabetes. They are also at lower risk for sudden infant death syndrome (SIDS).  Nutrients in breast milk are better able to meet your babys needs compared to infant formula.  Breast milk improves your baby's brain development. For you  Breastfeeding helps to create a very special bond between you and your baby.  Breastfeeding is convenient. Breast milk costs nothing and is always available at the correct temperature.  Breastfeeding helps to burn calories. It helps you to lose the weight that you gained during pregnancy.  Breastfeeding makes your uterus return faster to its size before pregnancy. It also slows  bleeding (lochia) after you give birth.  Breastfeeding helps to lower your risk of developing type 2 diabetes, osteoporosis, rheumatoid arthritis, cardiovascular disease, and breast, ovarian, uterine, and endometrial cancer later in life. Breastfeeding basics Starting breastfeeding  Find a comfortable place to sit or lie down, with your neck and back well-supported.  Place a pillow or a rolled-up blanket under your baby to bring him or her to the level of your breast (if you are seated). Nursing pillows are specially designed to help support your arms and your baby while you breastfeed.  Make sure that your baby's tummy (abdomen) is facing your abdomen.  Gently massage your breast. With your fingertips, massage from the outer edges of your breast inward toward the nipple. This encourages milk flow. If your milk flows slowly, you may need to continue this action during the feeding.  Support your breast with 4 fingers underneath and your thumb above your nipple (make the letter "C" with your hand). Make sure your fingers are well away from your nipple and your babys mouth.  Stroke your baby's lips gently with your finger or nipple.  When your baby's mouth is open wide enough, quickly bring your baby to  your breast, placing your entire nipple and as much of the areola as possible into your baby's mouth. The areola is the colored area around your nipple. ? More areola should be visible above your baby's upper lip than below the lower lip. ? Your baby's lips should be opened and extended outward (flanged) to ensure an adequate, comfortable latch. ? Your baby's tongue should be between his or her lower gum and your breast.  Make sure that your baby's mouth is correctly positioned around your nipple (latched). Your baby's lips should create a seal on your breast and be turned out (everted).  It is common for your baby to suck about 2-3 minutes in order to start the flow of breast  milk. Latching Teaching your baby how to latch onto your breast properly is very important. An improper latch can cause nipple pain, decreased milk supply, and poor weight gain in your baby. Also, if your baby is not latched onto your nipple properly, he or she may swallow some air during feeding. This can make your baby fussy. Burping your baby when you switch breasts during the feeding can help to get rid of the air. However, teaching your baby to latch on properly is still the best way to prevent fussiness from swallowing air while breastfeeding. Signs that your baby has successfully latched onto your nipple  Silent tugging or silent sucking, without causing you pain. Infant's lips should be extended outward (flanged).  Swallowing heard between every 3-4 sucks once your milk has started to flow (after your let-down milk reflex occurs).  Muscle movement above and in front of his or her ears while sucking. Signs that your baby has not successfully latched onto your nipple  Sucking sounds or smacking sounds from your baby while breastfeeding.  Nipple pain. If you think your baby has not latched on correctly, slip your finger into the corner of your babys mouth to break the suction and place it between your baby's gums. Attempt to start breastfeeding again. Signs of successful breastfeeding Signs from your baby  Your baby will gradually decrease the number of sucks or will completely stop sucking.  Your baby will fall asleep.  Your baby's body will relax.  Your baby will retain a small amount of milk in his or her mouth.  Your baby will let go of your breast by himself or herself. Signs from you  Breasts that have increased in firmness, weight, and size 1-3 hours after feeding.  Breasts that are softer immediately after breastfeeding.  Increased milk volume, as well as a change in milk consistency and color by the fifth day of breastfeeding.  Nipples that are not sore, cracked, or  bleeding. Signs that your baby is getting enough milk  Wetting at least 1-2 diapers during the first 24 hours after birth.  Wetting at least 5-6 diapers every 24 hours for the first week after birth. The urine should be clear or pale yellow by the age of 5 days.  Wetting 6-8 diapers every 24 hours as your baby continues to grow and develop.  At least 3 stools in a 24-hour period by the age of 5 days. The stool should be soft and yellow.  At least 3 stools in a 24-hour period by the age of 7 days. The stool should be seedy and yellow.  No loss of weight greater than 10% of birth weight during the first 3 days of life.  Average weight gain of 4-7 oz (113-198 g) per week  after the age of 4 days.  Consistent daily weight gain by the age of 5 days, without weight loss after the age of 2 weeks. After a feeding, your baby may spit up a small amount of milk. This is normal. Breastfeeding frequency and duration Frequent feeding will help you make more milk and can prevent sore nipples and extremely full breasts (breast engorgement). Breastfeed when you feel the need to reduce the fullness of your breasts or when your baby shows signs of hunger. This is called "breastfeeding on demand." Signs that your baby is hungry include:  Increased alertness, activity, or restlessness.  Movement of the head from side to side.  Opening of the mouth when the corner of the mouth or cheek is stroked (rooting).  Increased sucking sounds, smacking lips, cooing, sighing, or squeaking.  Hand-to-mouth movements and sucking on fingers or hands.  Fussing or crying. Avoid introducing a pacifier to your baby in the first 4-6 weeks after your baby is born. After this time, you may choose to use a pacifier. Research has shown that pacifier use during the first year of a baby's life decreases the risk of sudden infant death syndrome (SIDS). Allow your baby to feed on each breast as long as he or she wants. When your  baby unlatches or falls asleep while feeding from the first breast, offer the second breast. Because newborns are often sleepy in the first few weeks of life, you may need to awaken your baby to get him or her to feed. Breastfeeding times will vary from baby to baby. However, the following rules can serve as a guide to help you make sure that your baby is properly fed:  Newborns (babies 77 weeks of age or younger) may breastfeed every 1-3 hours.  Newborns should not go without breastfeeding for longer than 3 hours during the day or 5 hours during the night.  You should breastfeed your baby a minimum of 8 times in a 24-hour period. Breast milk pumping     Pumping and storing breast milk allows you to make sure that your baby is exclusively fed your breast milk, even at times when you are unable to breastfeed. This is especially important if you go back to work while you are still breastfeeding, or if you are not able to be present during feedings. Your lactation consultant can help you find a method of pumping that works best for you and give you guidelines about how long it is safe to store breast milk. Caring for your breasts while you breastfeed Nipples can become dry, cracked, and sore while breastfeeding. The following recommendations can help keep your breasts moisturized and healthy:  Avoid using soap on your nipples.  Wear a supportive bra designed especially for nursing. Avoid wearing underwire-style bras or extremely tight bras (sports bras).  Air-dry your nipples for 3-4 minutes after each feeding.  Use only cotton bra pads to absorb leaked breast milk. Leaking of breast milk between feedings is normal.  Use lanolin on your nipples after breastfeeding. Lanolin helps to maintain your skin's normal moisture barrier. Pure lanolin is not harmful (not toxic) to your baby. You may also hand express a few drops of breast milk and gently massage that milk into your nipples and allow the milk  to air-dry. In the first few weeks after giving birth, some women experience breast engorgement. Engorgement can make your breasts feel heavy, warm, and tender to the touch. Engorgement peaks within 3-5 days after you give  birth. The following recommendations can help to ease engorgement:  Completely empty your breasts while breastfeeding or pumping. You may want to start by applying warm, moist heat (in the shower or with warm, water-soaked hand towels) just before feeding or pumping. This increases circulation and helps the milk flow. If your baby does not completely empty your breasts while breastfeeding, pump any extra milk after he or she is finished.  Apply ice packs to your breasts immediately after breastfeeding or pumping, unless this is too uncomfortable for you. To do this: ? Put ice in a plastic bag. ? Place a towel between your skin and the bag. ? Leave the ice on for 20 minutes, 2-3 times a day.  Make sure that your baby is latched on and positioned properly while breastfeeding. If engorgement persists after 48 hours of following these recommendations, contact your health care provider or a Advertising copywriter. Overall health care recommendations while breastfeeding  Eat 3 healthy meals and 3 snacks every day. Well-nourished mothers who are breastfeeding need an additional 450-500 calories a day. You can meet this requirement by increasing the amount of a balanced diet that you eat.  Drink enough water to keep your urine pale yellow or clear.  Rest often, relax, and continue to take your prenatal vitamins to prevent fatigue, stress, and low vitamin and mineral levels in your body (nutrient deficiencies).  Do not use any products that contain nicotine or tobacco, such as cigarettes and e-cigarettes. Your baby may be harmed by chemicals from cigarettes that pass into breast milk and exposure to secondhand smoke. If you need help quitting, ask your health care provider.  Avoid  alcohol.  Do not use illegal drugs or marijuana.  Talk with your health care provider before taking any medicines. These include over-the-counter and prescription medicines as well as vitamins and herbal supplements. Some medicines that may be harmful to your baby can pass through breast milk.  It is possible to become pregnant while breastfeeding. If birth control is desired, ask your health care provider about options that will be safe while breastfeeding your baby. Where to find more information: [employer]: www.llli.org Contact a health care provider if:  You feel like you want to stop breastfeeding or have become frustrated with breastfeeding.  Your nipples are cracked or bleeding.  Your breasts are red, tender, or warm.  You have: ? Painful breasts or nipples. ? A swollen area on either breast. ? A fever or chills. ? Nausea or vomiting. ? Drainage other than breast milk from your nipples.  Your breasts do not become full before feedings by the fifth day after you give birth.  You feel sad and depressed.  Your baby is: ? Too sleepy to eat well. ? Having trouble sleeping. ? More than 19 week old and wetting fewer than 6 diapers in a 24-hour period. ? Not gaining weight by 4 days of age.  Your baby has fewer than 3 stools in a 24-hour period.  Your baby's skin or the white parts of his or her eyes become yellow. Get help right away if:  Your baby is overly tired (lethargic) and does not want to wake up and feed.  Your baby develops an unexplained fever. Summary  Breastfeeding offers many health benefits for infant and mothers.  Try to breastfeed your infant when he or she shows early signs of hunger.  Gently tickle or stroke your baby's lips with your finger or nipple to allow the baby  to open his or her mouth. Bring the baby to your breast. Make sure that much of the areola is in your baby's mouth. Offer one side and burp the baby before you offer  the other side.  Talk with your health care provider or lactation consultant if you have questions or you face problems as you breastfeed. This information is not intended to replace advice given to you by your health care provider. Make sure you discuss any questions you have with your health care provider. Document Released: 02/08/2005 Document Revised: 03/12/2016 Document Reviewed: 03/12/2016 Elsevier Interactive Patient Education  2019 [employer].

## 2018-07-11 NOTE — Progress Notes (Signed)
Dc inst reviewed with pt.  Verb u/o 

## 2018-07-11 NOTE — Discharge Summary (Signed)
Antenatal Physician Discharge Summary  Patient ID: Holly BouillonKristen Tate MRN: [id number] DOB/AGE: 11/10/1990 28 y.o.  Admit date: 07/07/2018 Discharge date: 07/11/2018  Admission Diagnoses:  Discharge Diagnoses:   Prenatal Procedures: NST  Consults: Neonatology, Maternal Fetal Medicine  Significant Diagnostic Studies:  Results for orders placed or performed during the hospital encounter of 07/09/18 (from the past 168 hour(s))  CBC   Collection Time: 07/09/18  1:20 AM  Result Value Ref Range   WBC 9.8 4.0 - 10.5 K/uL   RBC 3.27 (L) 3.87 - 5.11 MIL/uL   Hemoglobin 10.6 (L) 12.0 - 15.0 g/dL   HCT 40.930.8 (L) 81.136.0 - 91.446.0 %   MCV 94.2 80.0 - 100.0 fL   MCH 32.4 26.0 - 34.0 pg   MCHC 34.4 30.0 - 36.0 g/dL   RDW 78.212.3 95.611.5 - 21.315.5 %   Platelets 167 150 - 400 K/uL   nRBC 0.0 0.0 - 0.2 %  RPR   Collection Time: 07/09/18  1:20 AM  Result Value Ref Range   RPR Ser Ql Non Reactive Non Reactive  Type and screen   Collection Time: 07/09/18  1:20 AM  Result Value Ref Range   ABO/RH(D) O POS    Antibody Screen NEG    Sample Expiration      07/12/2018,2359 Performed at [facility], [address] [place], [place] [address]   CBC   Collection Time: 07/10/18  6:05 AM  Result Value Ref Range   WBC 22.6 (H) 4.0 - 10.5 K/uL   RBC 2.83 (L) 3.87 - 5.11 MIL/uL   Hemoglobin 9.3 (L) 12.0 - 15.0 g/dL   HCT 84.626.9 (L) 96.236.0 - 95.246.0 %   MCV 95.1 80.0 - 100.0 fL   MCH 32.9 26.0 - 34.0 pg   MCHC 34.6 30.0 - 36.0 g/dL   RDW 84.112.4 32.411.5 - 40.115.5 %   Platelets 157 150 - 400 K/uL   nRBC 0.0 0.0 - 0.2 %  Results for orders placed or performed during the hospital encounter of 07/07/18 (from the past 168 hour(s))  Protein / creatinine ratio, urine   Collection Time: 07/07/18  1:14 PM  Result Value Ref Range   Creatinine, Urine 29 mg/dL   Total Protein, Urine <6 mg/dL   Protein Creatinine Ratio        0.00 - 0.15 mg/mg[Cre]  CBC with Differential/Platelet   Collection Time: 07/07/18  1:27 PM  Result  Value Ref Range   WBC 9.3 4.0 - 10.5 K/uL   RBC 3.25 (L) 3.87 - 5.11 MIL/uL   Hemoglobin 10.6 (L) 12.0 - 15.0 g/dL   HCT 02.730.6 (L) 25.336.0 - 66.446.0 %   MCV 94.2 80.0 - 100.0 fL   MCH 32.6 26.0 - 34.0 pg   MCHC 34.6 30.0 - 36.0 g/dL   RDW 40.312.3 47.411.5 - 25.915.5 %   Platelets 163 150 - 400 K/uL   nRBC 0.0 0.0 - 0.2 %   Neutrophils Relative % 75 %   Neutro Abs 7.0 1.7 - 7.7 K/uL   Lymphocytes Relative 16 %   Lymphs Abs 1.5 0.7 - 4.0 K/uL   Monocytes Relative 7 %   Monocytes Absolute 0.6 0.1 - 1.0 K/uL   Eosinophils Relative 1 %   Eosinophils Absolute 0.1 0.0 - 0.5 K/uL   Basophils Relative 0 %   Basophils Absolute 0.0 0.0 - 0.1 K/uL   Immature Granulocytes 1 %   Abs Immature Granulocytes 0.05 0.00 - 0.07 K/uL  Comprehensive metabolic panel   Collection  Time: 07/07/18  1:27 PM  Result Value Ref Range   Sodium 133 (L) 135 - 145 mmol/L   Potassium 3.5 3.5 - 5.1 mmol/L   Chloride 106 98 - 111 mmol/L   CO2 19 (L) 22 - 32 mmol/L   Glucose, Bld 106 (H) 70 - 99 mg/dL   BUN 9 6 - 20 mg/dL   Creatinine, Ser 5.78 0.44 - 1.00 mg/dL   Calcium 8.4 (L) 8.9 - 10.3 mg/dL   Total Protein 5.7 (L) 6.5 - 8.1 g/dL   Albumin 2.7 (L) 3.5 - 5.0 g/dL   AST 19 15 - 41 U/L   ALT 12 0 - 44 U/L   Alkaline Phosphatase 168 (H) 38 - 126 U/L   Total Bilirubin 0.5 0.3 - 1.2 mg/dL   GFR calc non Af Amer >60 >60 mL/min   GFR calc Af Amer >60 >60 mL/min   Anion gap 8 5 - 15  Vitamin B12   Collection Time: 07/07/18  1:27 PM  Result Value Ref Range   Vitamin B-12 213 180 - 914 pg/mL  Sample to Blood Bank   Collection Time: 07/07/18  1:27 PM  Result Value Ref Range   Blood Bank Specimen SAMPLE AVAILABLE FOR TESTING    Sample Expiration      07/10/2018,2359 Performed at [facility], [address] [place], [place] [address]   SARS Coronavirus 2 [facility] order, Performed in [facility] hospital lab)   Collection Time: 07/07/18  4:35 PM  Result Value Ref Range   SARS Coronavirus 2 NEGATIVE NEGATIVE   Results for orders placed or performed in visit on 07/06/18 (from the past 168 hour(s))  POC Urinalysis Dipstick OB   Collection Time: 07/06/18  2:39 PM  Result Value Ref Range   Color, UA yellow    Clarity, UA clear    Glucose, UA Negative Negative   Bilirubin, UA neg    Ketones, UA neg    Spec Grav, UA <=1.005 (A) 1.010 - 1.025   Blood, UA neg    pH, UA 7.5 5.0 - 8.0   POC,PROTEIN,UA Negative Negative, Trace, Small (1+), Moderate (2+), Large (3+), 4+   Urobilinogen, UA 0.2 0.2 or 1.0 E.U./dL   Nitrite, UA neg    Leukocytes, UA Negative Negative   Appearance     Odor      Treatments: labs  Hospital Course:  This is a 28 y.o. G1P0 with IUP at [redacted]w[redacted]d in observation for dizziness.   No leaking of fluid and no bleeding.  She was observed, fetal heart rate monitoring remained reassuring, and she had no signs/symptoms of  maternal-fetal concerns.  Her cervical exam was unchanged from admission.  She was deemed stable for discharge to home with outpatient follow up.  Discharge Exam: BP 123/88   Pulse 90   Temp 98.5 F (36.9 C) (Oral)   Resp 16   Ht 5' 5.5" (1.664 m)   Wt 79.8 kg   LMP 09/28/2017   BMI 28.84 kg/m  General appearance: alert, cooperative and appears stated age Head: Normocephalic, without obvious abnormality, atraumatic Resp: clear to auscultation bilaterally Cardio: regular rate and rhythm, S1, S2 normal, no murmur, click, rub or gallop GI: soft, non-tender; bowel sounds normal; no masses,  no organomegaly Extremities: extremities normal, atraumatic, no cyanosis or edema NST performed today was reviewed and was found to be reactive. Baseline120 with Moderate variability; No decels noted.  Continue recommended antenatal testing and prenatal care. All labs stable Discharge Condition:  Stable  Disposition: Discharge disposition: 01-Home or Self Care        Allergies as of 07/07/2018      Reactions   Omnicef [cefdinir] Rash   Penicillins Swelling, Rash       Medication List    ASK your doctor about these medications   hydrOXYzine 25 MG tablet Commonly known as:  ATARAX/VISTARIL Take 1 tablet (25 mg total) by mouth every 6 (six) hours as needed for itching.   prenatal multivitamin Tabs tablet Take 1 tablet by mouth daily at 12 noon.        Lilli Few [facility]

## 2018-07-11 NOTE — Discharge Summary (Signed)
Obstetric Discharge Summary  Patient ID: Holly Tate MRN: [id number] DOB/AGE: Sep 11, 1990 28 y.o.   Date of Admission: 07/09/2018  Date of Discharge:  07/11/18  Admitting Diagnosis: Induction of labor at [redacted]w[redacted]d  Secondary Diagnosis: Dizzy spells, visual changes  Mode of Delivery: normal spontaneous vaginal delivery     Discharge Diagnosis: No other diagnosis   Intrapartum Procedures: epidural, pitocin augmentation and cytotec   Post partum procedures: None  Complications: Second degree perineal laceration, repaired   Brief Hospital Course   Holly Tate is a G1P0 who had a SVD on 07/09/2018;  for further details of this birth, please refer to the delivey note.  Patient had an uncomplicated postpartum course.  By time of discharge on PPD#2, her pain was controlled on oral pain medications; she had appropriate lochia and was ambulating, voiding without difficulty and tolerating regular diet.  She was deemed stable for discharge to home.    Labs: CBC Latest Ref Rng & Units 07/10/2018 07/09/2018 07/07/2018  WBC 4.0 - 10.5 K/uL 22.6(H) 9.8 9.3  Hemoglobin 12.0 - 15.0 g/dL 2.3(J) 10.6(L) 10.6(L)  Hematocrit 36.0 - 46.0 % 26.9(L) 30.8(L) 30.6(L)  Platelets 150 - 400 K/uL 157 167 163   O POS  Physical exam:   Temp:  [97.8 F (36.6 C)-98.4 F (36.9 C)] 97.8 F (36.6 C) (05/19 0819) Pulse Rate:  [82-94] 82 (05/19 0819) Resp:  [18] 18 (05/18 2340) BP: (102-125)/(53-77) 102/53 (05/19 0819) SpO2:  [99 %-100 %] 99 % (05/19 0819)  General: alert and no distress  Lochia: appropriate  Abdomen: soft, NT  Uterine Fundus: firm  Perineum: healing well, no significant drainage, no dehiscence, no significant erythema  Extremities: No evidence of DVT seen on physical exam. No lower extremity edema.  Discharge Instructions: Per After Visit Summary. Activity: Advance as tolerated. Pelvic rest for 6 weeks.  Also refer to After Visit Summary Diet: Regular Medications: Allergies  as of 07/11/2018      Reactions   Omnicef [cefdinir] Rash   Penicillins Swelling, Rash      Medication List    STOP taking these medications   hydrOXYzine 25 MG tablet Commonly known as:  ATARAX/VISTARIL     TAKE these medications   ferrous sulfate 325 (65 FE) MG tablet Take 1 tablet (325 mg total) by mouth 2 (two) times daily with a meal.   ibuprofen 600 MG tablet Commonly known as:  ADVIL Take 1 tablet (600 mg total) by mouth every 6 (six) hours.   prenatal multivitamin Tabs tablet Take 1 tablet by mouth daily at 12 noon.      Outpatient follow up:  Follow-up Information    Norphlet, Melodye Ped, CNM. Call in 6 week(s).   Specialties:  Obstetrics and Gynecology, Radiology Why:  Please call to schedule six (6) week postpartum visit with [facility] Contact information: [address] [place] [address] [phone]          Postpartum contraception: condoms  Discharged Condition: stable  Discharged to: home   Newborn Data:  Disposition:home with mother  Apgars: APGAR (1 MIN): 8   APGAR (5 MINS): 9    Baby Feeding: Bottle and Breast   Holly Tate, CNM [facility] 07/11/18 12:19 PM

## 2018-07-11 NOTE — Lactation Note (Signed)
This note was copied from a baby's chart. Lactation Consultation Note  Patient Name: Holly Tate Date: 07/11/2018 Reason for consult: Follow-up assessment   Maternal Data    Feeding Feeding Type: Breast Fed  LATCH Score                   Interventions    Lactation Tools Discussed/Used     Consult Status Consult Status: Complete  Parents had question about how to know if baby is getting enough at the breast. LC explained that normal wt loss is up to 10% in the early days of life and that feedings have been going well according to baby's wt loss of 1% and output. LC discussed following baby's feeding cues, knowing that baby is transferring milk well, signs of fullness, how to overcome soreness/engorgement and support and help after d/c from hospital. [facility] gave parents cool gels to take home.   Burnadette Peter 07/11/2018, 11:08 AM

## 2018-08-30 ENCOUNTER — Telehealth: Payer: Self-pay

## 2018-08-30 NOTE — Telephone Encounter (Signed)
Coronavirus (COVID-19) Are you at risk?  Are you at risk for the Coronavirus (COVID-19)?  To be considered HIGH RISK for Coronavirus (COVID-19), you have to meet the following criteria:  . Traveled to [place], [place], [place], [place] or [place]; or in the [place] to [place], [place], [place], or [place]; and have fever, cough, and shortness of breath within the last 2 weeks of travel OR . Been in close contact with a person diagnosed with COVID-19 within the last 2 weeks and have fever, cough, and shortness of breath . IF YOU DO NOT MEET THESE CRITERIA, YOU ARE CONSIDERED LOW RISK FOR COVID-19.  What to do if you are HIGH RISK for COVID-19?  . If you are having a medical emergency, call 911. . Seek medical care right away. Before you go to a doctor's office, urgent care or emergency department, call ahead and tell them about your recent travel, contact with someone diagnosed with COVID-19, and your symptoms. You should receive instructions from your physician's office regarding next steps of care.  . When you arrive at healthcare provider, tell the healthcare staff immediately you have returned from visiting [place], [place], [place], [place] or [place]; or traveled in the [place] to [place], [place], [place], or [place]; in the last two weeks or you have been in close contact with a person diagnosed with COVID-19 in the last 2 weeks.   . Tell the health care staff about your symptoms: fever, cough and shortness of breath. . After you have been seen by a medical provider, you will be either: o Tested for (COVID-19) and discharged home on quarantine except to seek medical care if symptoms worsen, and asked to  - Stay home and avoid contact with others until you get your results (4-5 days)  - Avoid travel on public transportation if possible (such as bus, train, or airplane) or o Sent to the Emergency Department by EMS for evaluation, COVID-19 testing, and possible  admission depending on your condition and test results.  What to do if you are LOW RISK for COVID-19?  Reduce your risk of any infection by using the same precautions used for avoiding the common cold or flu:  . Wash your hands often with soap and warm water for at least 20 seconds.  If soap and water are not readily available, use an alcohol-based hand sanitizer with at least 60% alcohol.  . If coughing or sneezing, cover your mouth and nose by coughing or sneezing into the elbow areas of your shirt or coat, into a tissue or into your sleeve (not your hands). . Avoid shaking hands with others and consider head nods or verbal greetings only. . Avoid touching your eyes, nose, or mouth with unwashed hands.  . Avoid close contact with people who are Mariusz Jubb. . Avoid places or events with large numbers of people in one location, like concerts or sporting events. . Carefully consider travel plans you have or are making. . If you are planning any travel outside or inside the [place], visit the CDC's Travelers' Health webpage for the latest health notices. . If you have some symptoms but not all symptoms, continue to monitor at home and seek medical attention if your symptoms worsen. . If you are having a medical emergency, call 911.  08/30/18 SCREENING NEG SLS ADDITIONAL HEALTHCARE OPTIONS FOR PATIENTS  [facility] / e-Visit: [email or web address]         [facility]: [phone]    [facility]: [phone]                   [facility]: [phone]  

## 2018-08-31 ENCOUNTER — Other Ambulatory Visit: Payer: Self-pay

## 2018-08-31 ENCOUNTER — Ambulatory Visit (INDEPENDENT_AMBULATORY_CARE_PROVIDER_SITE_OTHER): Payer: Managed Care, Other (non HMO) | Admitting: Obstetrics and Gynecology

## 2018-08-31 ENCOUNTER — Encounter: Payer: Self-pay | Admitting: Obstetrics and Gynecology

## 2018-08-31 NOTE — Patient Instructions (Signed)
  Place postpartum visit patient instructions here.  

## 2018-08-31 NOTE — Progress Notes (Signed)
  Subjective:     Holly Tate is a 28 y.o. female who presents for a postpartum visit. She is 8 weeks postpartum following a spontaneous vaginal delivery. I have fully reviewed the prenatal and intrapartum course. The delivery was at 40 gestational weeks. Outcome: spontaneous vaginal delivery. Anesthesia: epidural. Postpartum course has been uncomplicated. Baby's course has been uncomplicated. Baby is feeding by formula since 76 weeks of age. Bleeding no bleeding. Bowel function is normal. Bladder function is normal. Patient is not sexually active. Contraception method is abstinence. Postpartum depression screening: negative.  The following portions of the patient's history were reviewed and updated as appropriate: allergies, current medications, past family history, past medical history, past social history, past surgical history and problem list.  Review of Systems A comprehensive review of systems was negative.   Objective:    BP 111/69   Pulse 71   Ht 5' 5.5" (1.664 m)   Wt 148 lb 2 oz (67.2 kg)   LMP 08/21/2018 (Exact Date)   Breastfeeding No   BMI 24.27 kg/m   General:  alert, cooperative and appears stated age   Breasts:  inspection negative, no nipple discharge or bleeding, no masses or nodularity palpable  Lungs: clear to auscultation bilaterally  Heart:  regular rate and rhythm, S1, S2 normal, no murmur, click, rub or gallop  Abdomen: soft, non-tender; bowel sounds normal; no masses,  no organomegaly   Vulva:  normal  Vagina: normal vagina, no discharge, exudate, lesion, or erythema  Cervix:  multiparous appearance  Corpus: normal size, contour, position, consistency, mobility, non-tender  Adnexa:  normal adnexa  Rectal Exam: Not performed.        Assessment:     8 weeks postpartum exam. Pap smear not done at today's visit.   Plan:    1. Contraception: condoms 2. Labs obtained- will follow up accordingly 3. Follow up in: 3 months or as needed.

## 2018-09-01 ENCOUNTER — Telehealth: Payer: Self-pay

## 2018-09-01 ENCOUNTER — Other Ambulatory Visit: Payer: Self-pay | Admitting: Obstetrics and Gynecology

## 2018-09-01 DIAGNOSIS — E559 Vitamin D deficiency, unspecified: Secondary | ICD-10-CM | POA: Insufficient documentation

## 2018-09-01 LAB — CBC
Hematocrit: 35.8 % (ref 34.0–46.6)
Hemoglobin: 11.6 g/dL (ref 11.1–15.9)
MCH: 29.9 pg (ref 26.6–33.0)
MCHC: 32.4 g/dL (ref 31.5–35.7)
MCV: 92 fL (ref 79–97)
Platelets: 272 10*3/uL (ref 150–450)
RBC: 3.88 x10E6/uL (ref 3.77–5.28)
RDW: 13.1 % (ref 11.7–15.4)
WBC: 4.7 10*3/uL (ref 3.4–10.8)

## 2018-09-01 LAB — FERRITIN: Ferritin: 7 ng/mL — ABNORMAL LOW (ref 15–150)

## 2018-09-01 LAB — VITAMIN D 25 HYDROXY (VIT D DEFICIENCY, FRACTURES): Vit D, 25-Hydroxy: 23.1 ng/mL — ABNORMAL LOW (ref 30.0–100.0)

## 2018-09-01 MED ORDER — VITAMIN D (ERGOCALCIFEROL) 1.25 MG (50000 UNIT) PO CAPS: 50000.0000 [IU] | ORAL_CAPSULE | ORAL | 1 refills | Status: AC

## 2018-09-01 NOTE — Telephone Encounter (Signed)
Info mailed.  

## 2018-09-01 NOTE — Telephone Encounter (Signed)
-----   Message from Joylene Igo, [place] sent at 09/01/2018  1:03 PM EDT ----- Please mail info on vit d def.

## 2018-12-01 ENCOUNTER — Encounter: Payer: Managed Care, Other (non HMO) | Admitting: Obstetrics and Gynecology

## 2018-12-29 ENCOUNTER — Encounter: Payer: Managed Care, Other (non HMO) | Admitting: Obstetrics and Gynecology

## 2020-03-05 IMAGING — US ULTRASOUND ABDOMEN LIMITED
1 series · 14 of 25 positions shown · non-contrast
Comparison: None.

CLINICAL DATA: Twenty-nine weeks pregnant. Right upper quadrant and
right lower quadrant pain.

EXAM:
ULTRASOUND ABDOMEN LIMITED RIGHT UPPER QUADRANT

[Series 1: ultrasound abdomen limited · 0.19mm/px · 14 of 42 slices shown]
[im 1/42]
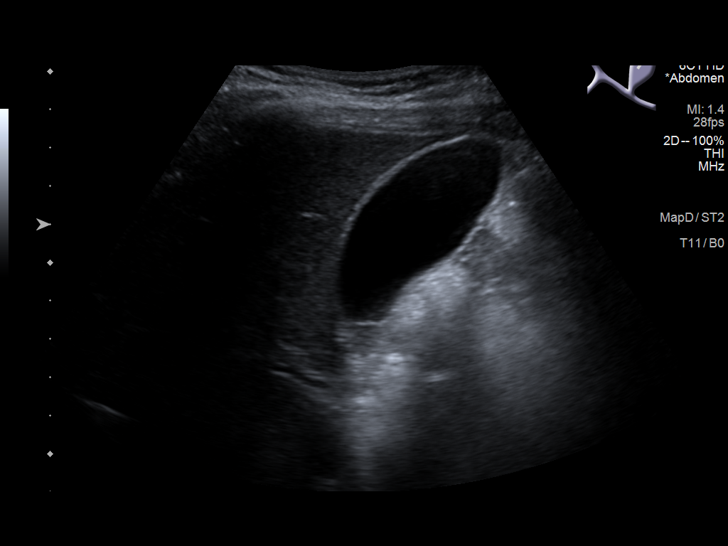
[im 4/42]
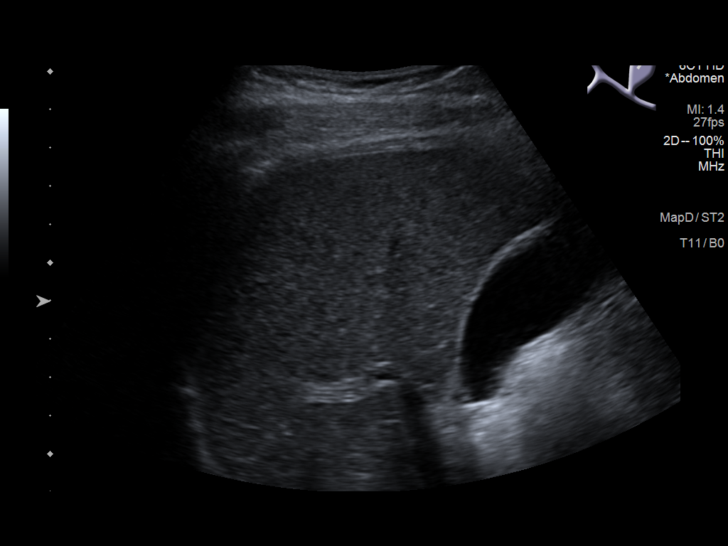
[im 7/42]
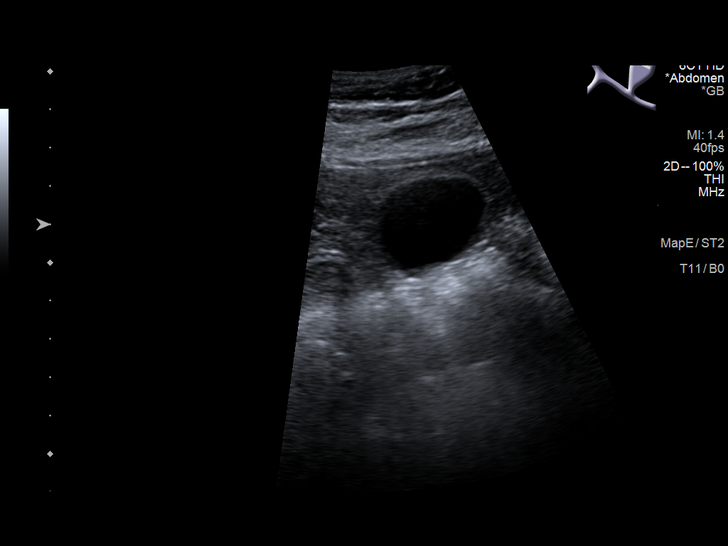
[im 11/42]
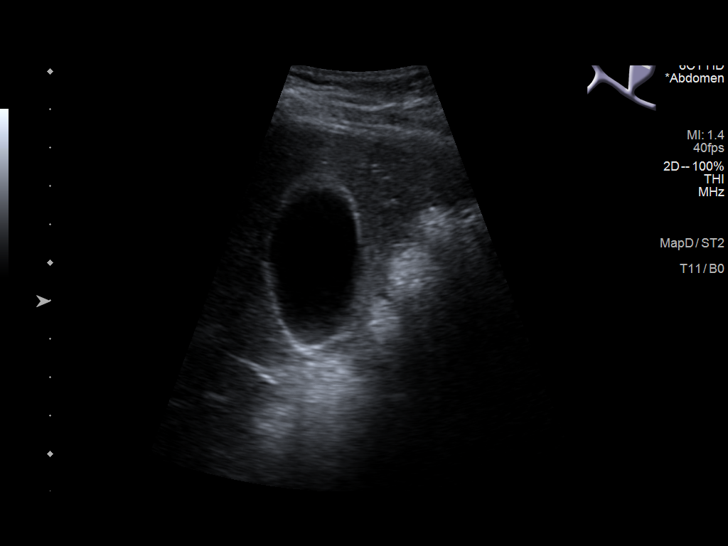
[im 14/42]
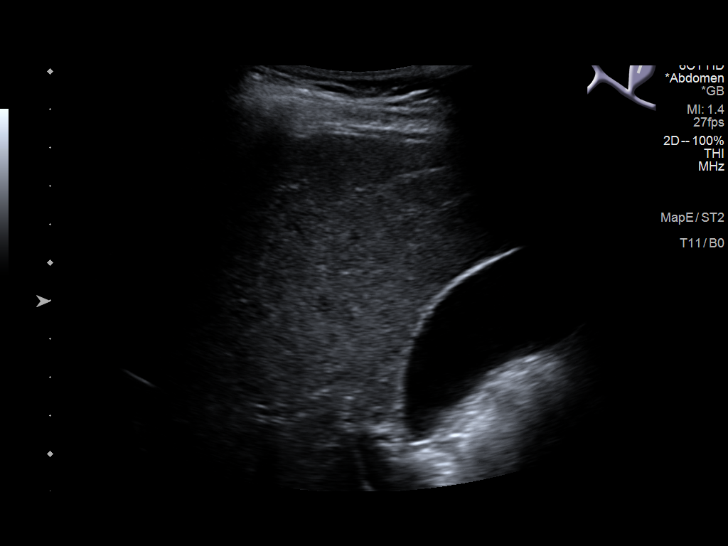
[im 16/42]
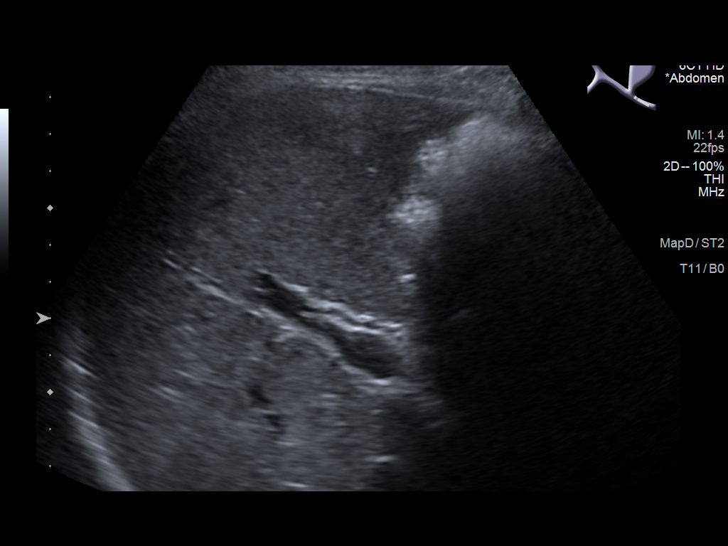
[im 19/42]
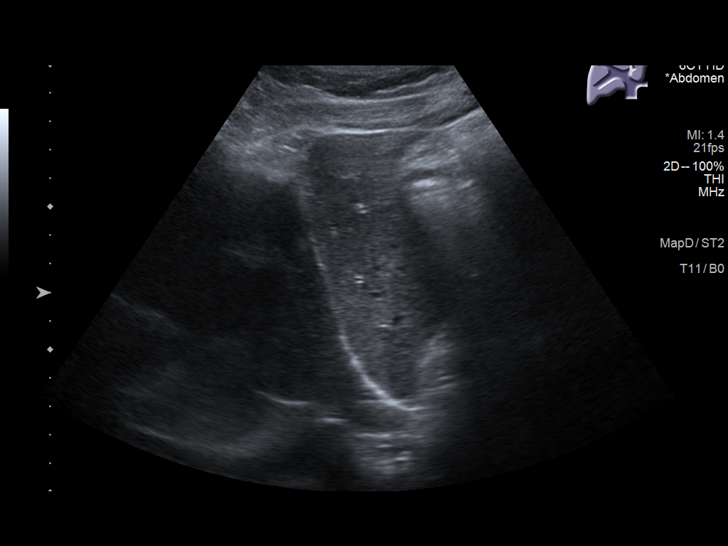
[im 23/42]
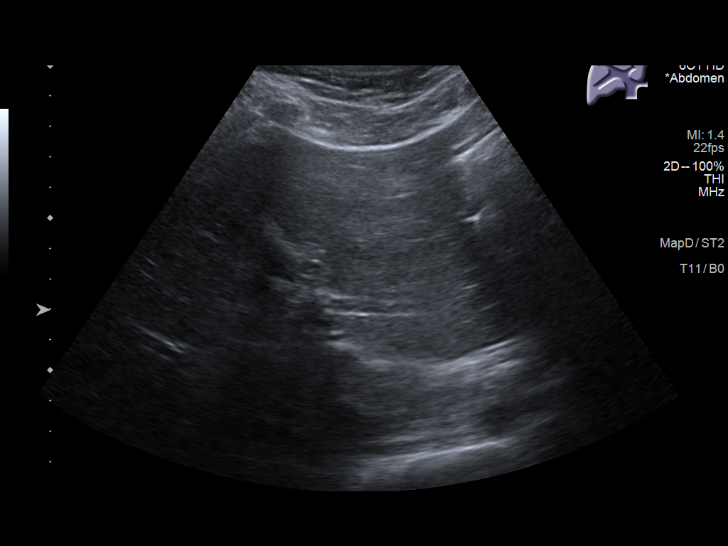
[im 26/42]
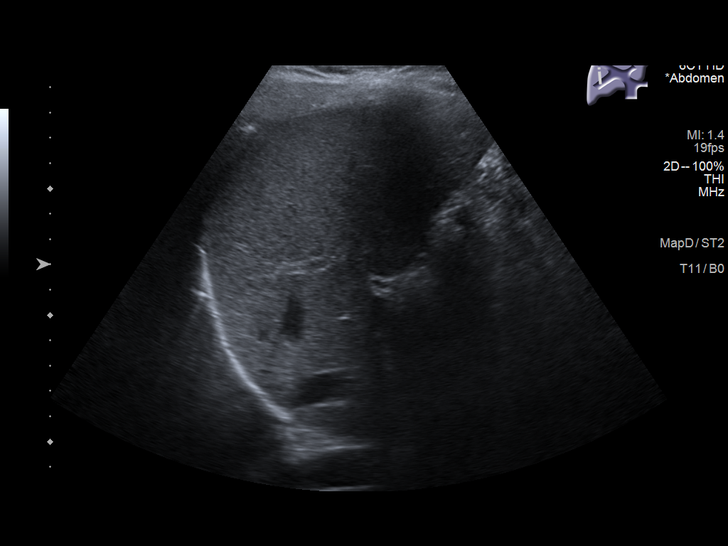
[im 28/42]
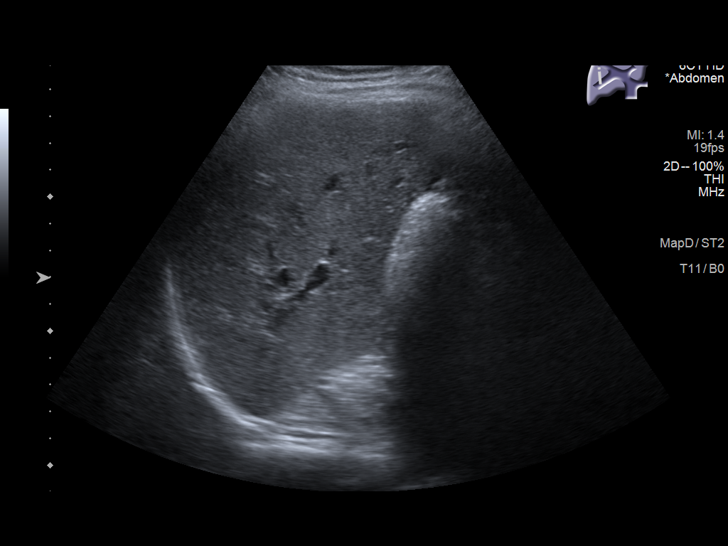
[im 31/42]
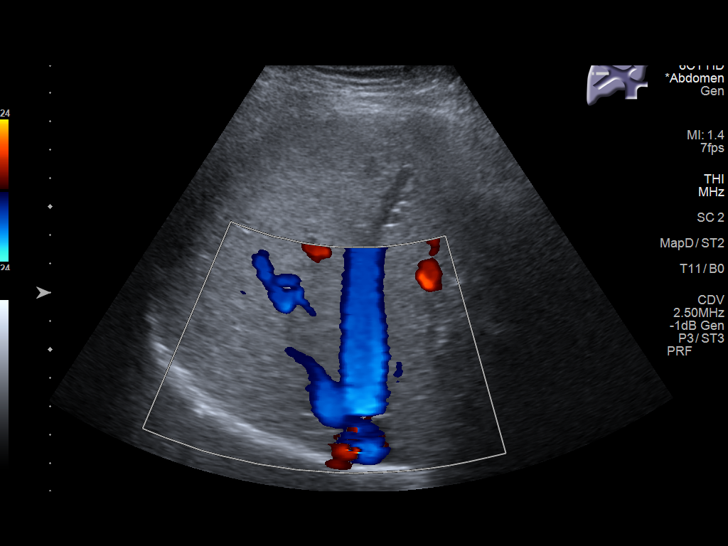
[im 35/42]
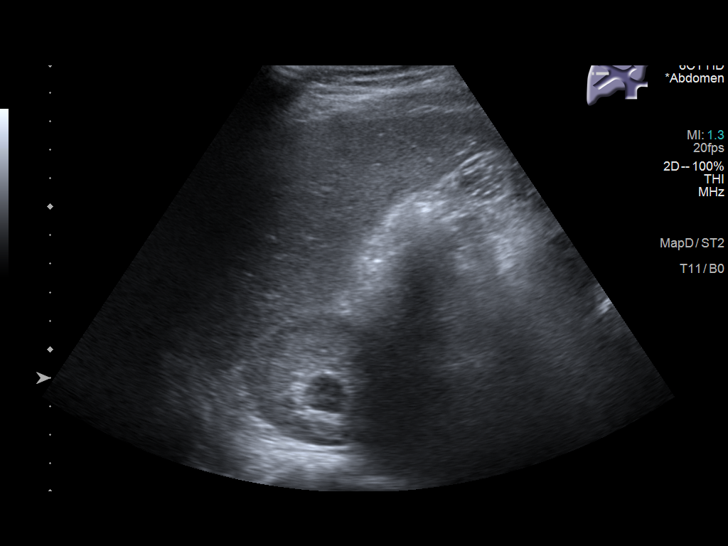
[im 38/42]
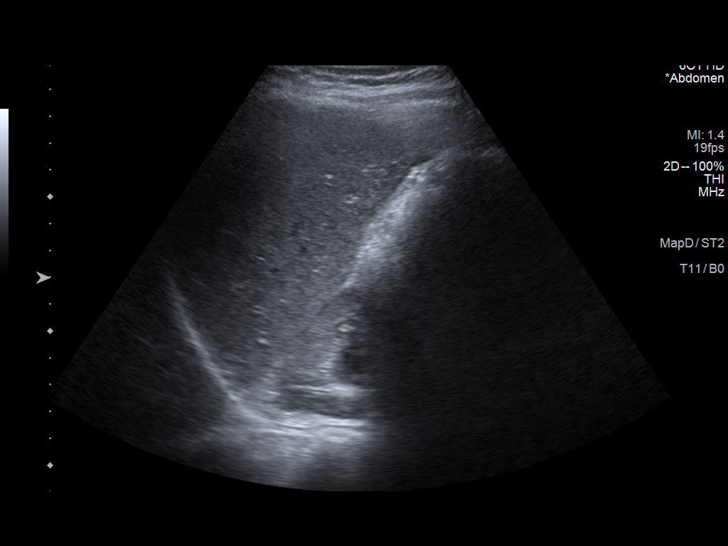
[im 42/42]
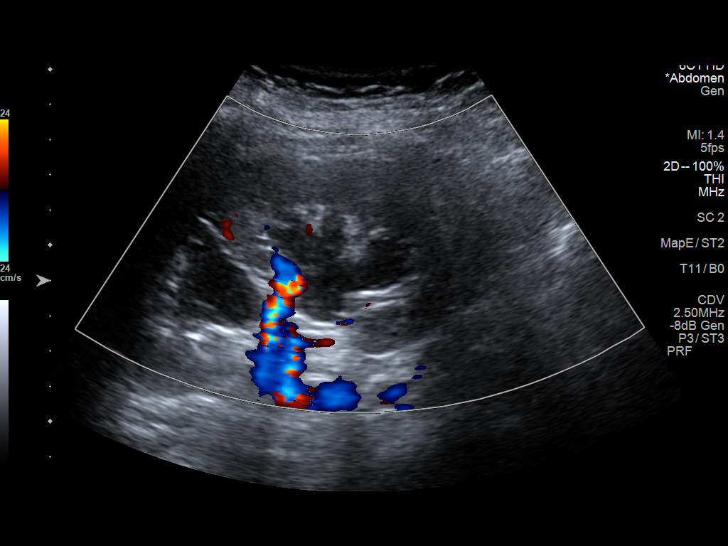

[14 of 25 positions shown; findings below may reference images not displayed]

FINDINGS: Gallbladder:

No gallstones or wall thickening visualized. No sonographic Murphy
sign noted by sonographer.

Common bile duct:

Diameter: 2.9 mm

Liver:

No focal lesion identified. Within normal limits in parenchymal
echogenicity. Portal vein is patent on color Doppler imaging with
normal direction of blood flow towards the liver.

Other: There is moderate right-sided hydronephrosis.
IMPRESSION: 1. Normal appearance of the gallbladder and liver.
2. Moderate right-sided hydronephrosis.

## 2020-03-05 IMAGING — US US RENAL
1 series · 14 of 25 positions shown · non-contrast
Comparison: None.

CLINICAL DATA: Right side pain in pregnancy

EXAM:
RENAL / URINARY TRACT ULTRASOUND COMPLETE

[Series 1: us renal · 14 of 52 slices shown]
[im 1/52]
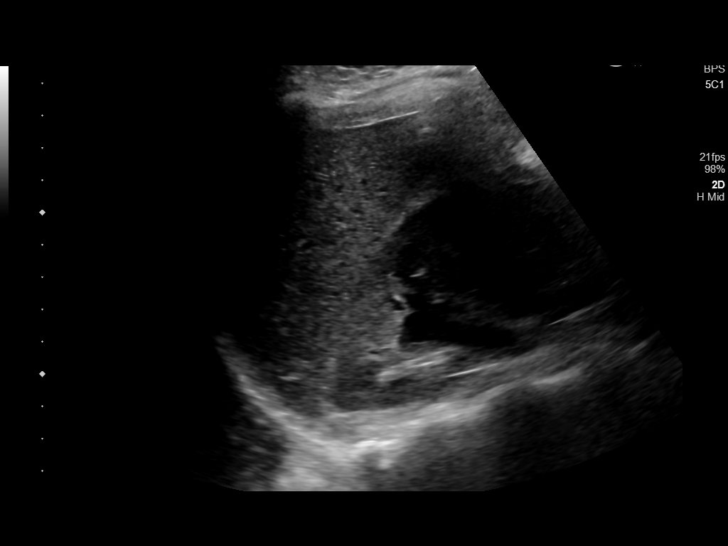
[im 5/52]
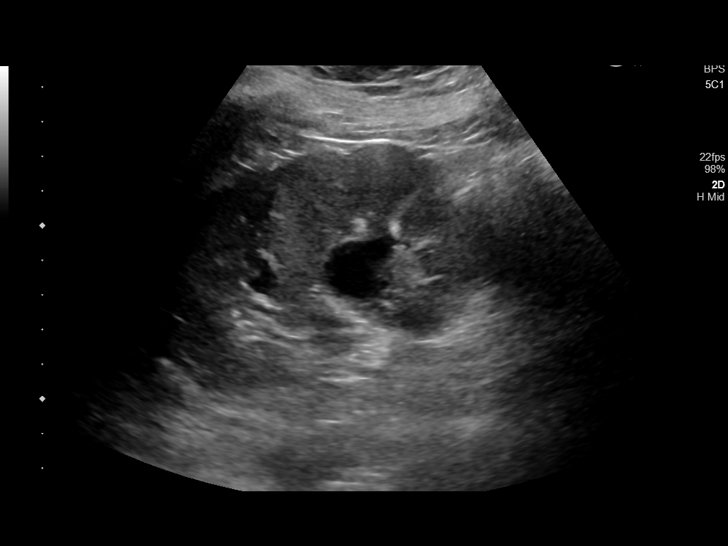
[im 9/52]
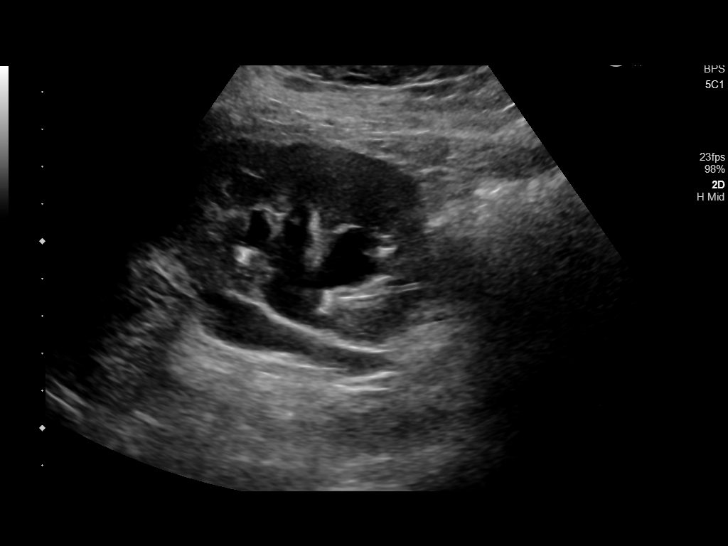
[im 13/52]
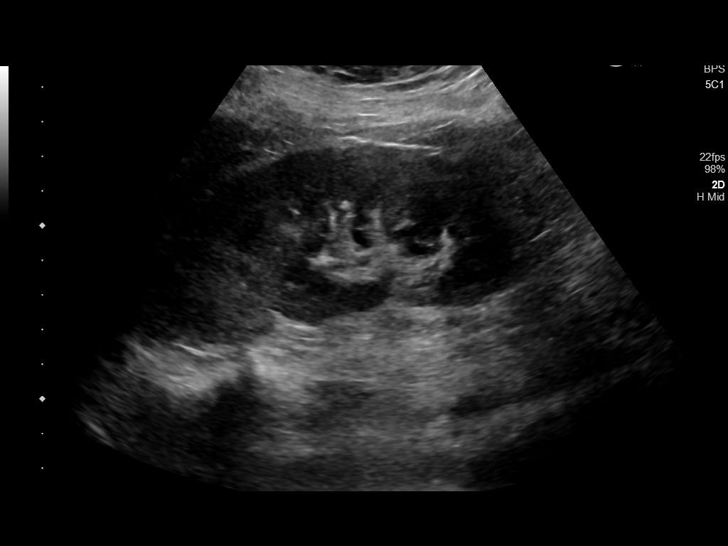
[im 18/52]
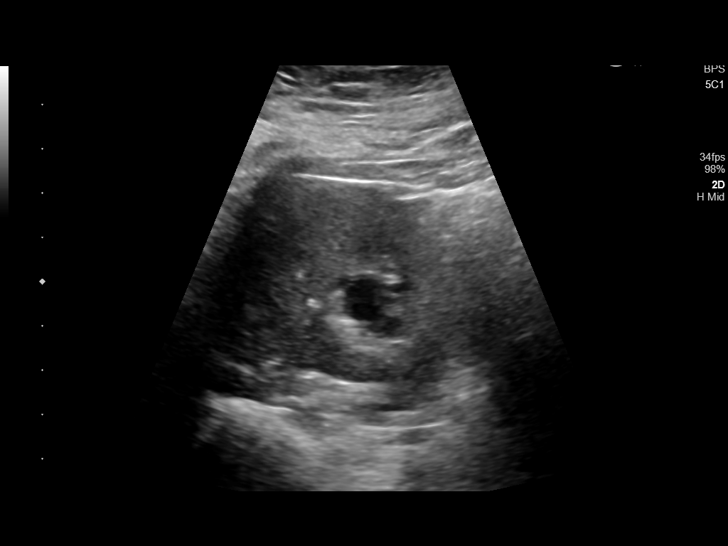
[im 20/52]
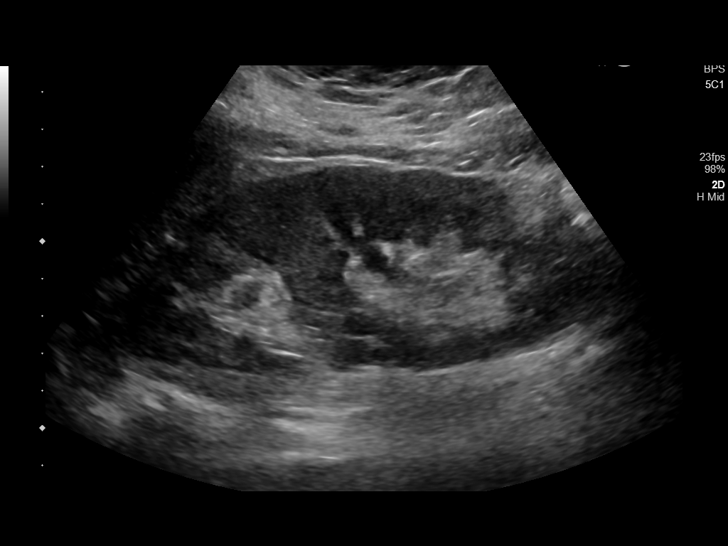
[im 24/52]
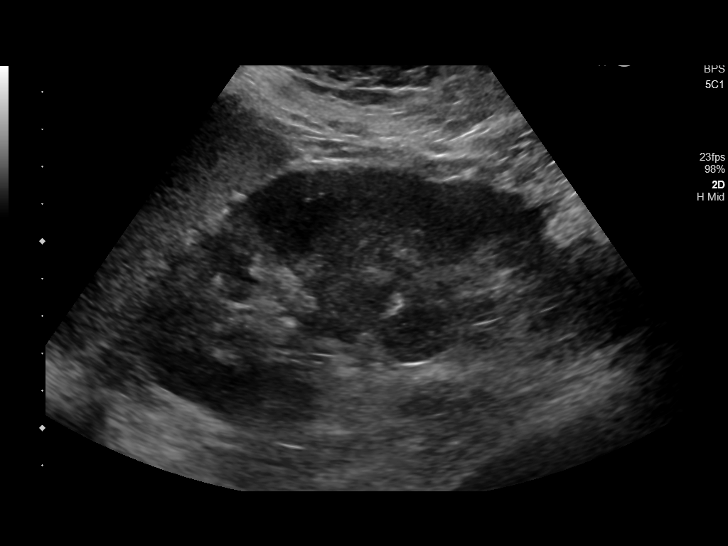
[im 28/52]
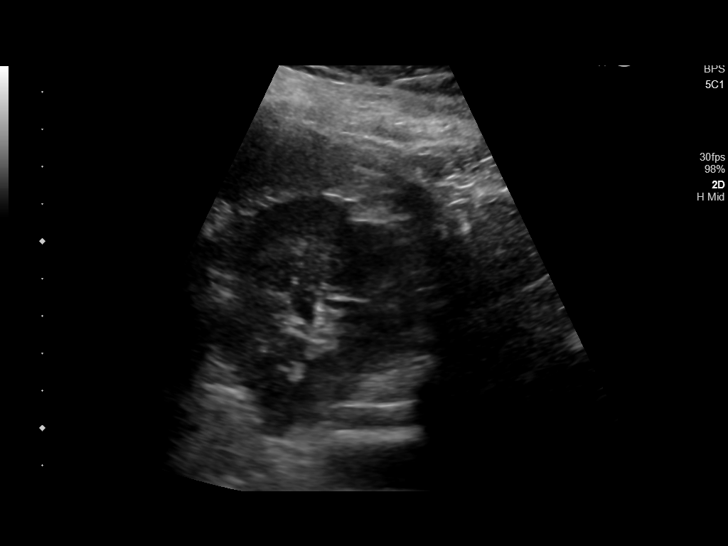
[im 32/52]
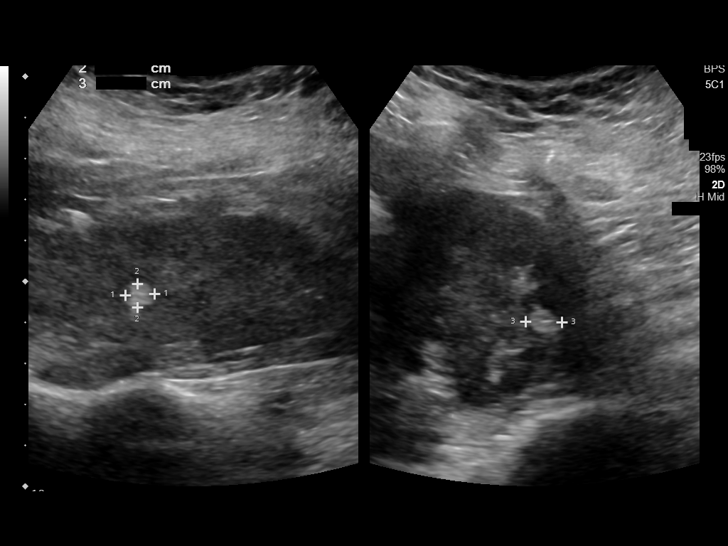
[im 35/52]
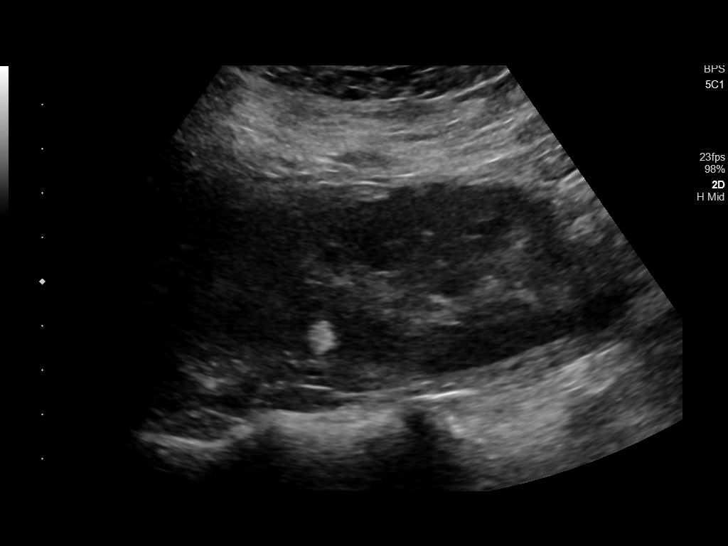
[im 39/52]
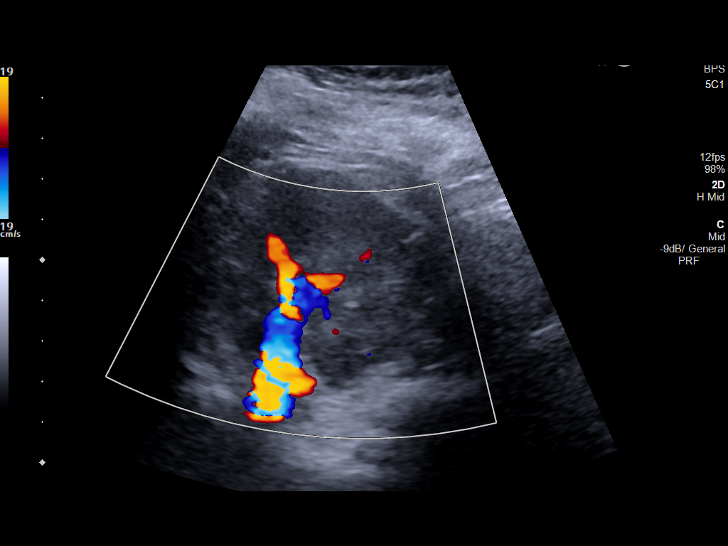
[im 43/52]
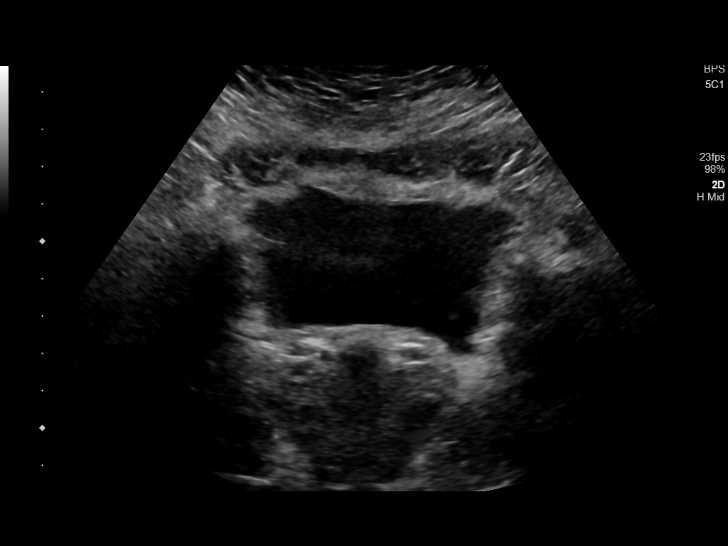
[im 47/52]
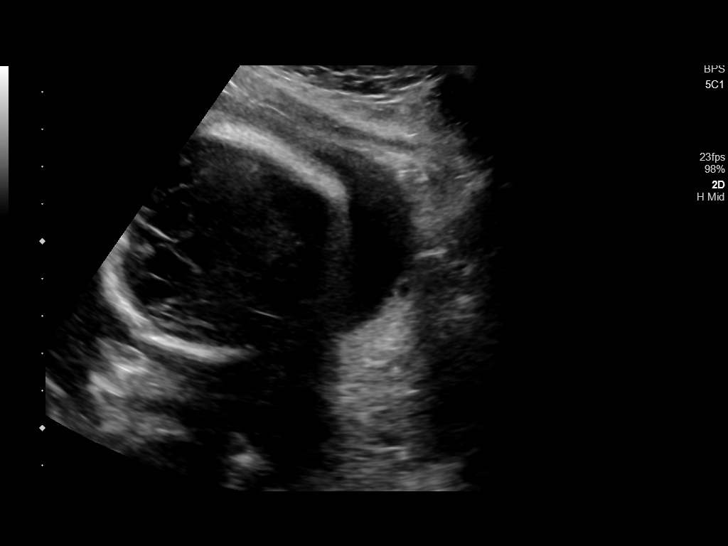
[im 52/52]
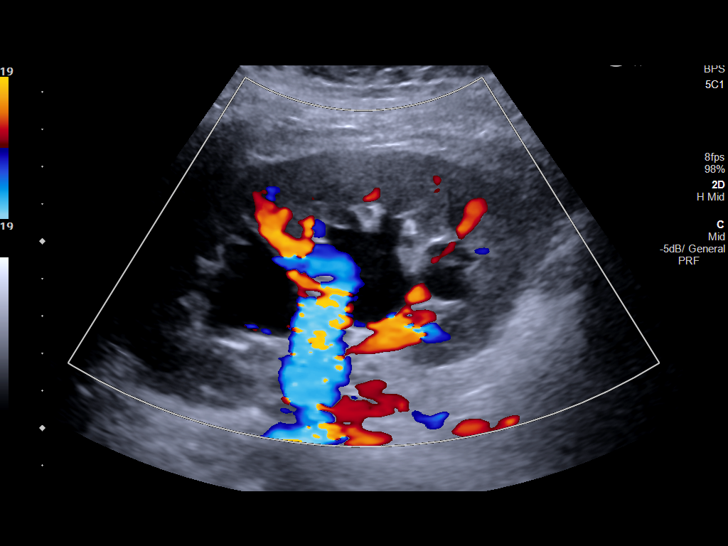

[14 of 25 positions shown; findings below may reference images not displayed]

FINDINGS: Right Kidney:

Renal measurements: 10.9 x 6.6 x 5.7 cm = volume: 214 mL. Moderate
right hydronephrosis. No mass. Normal echotexture.

Left Kidney:

Renal measurements: 12.7 x 5.6 x 4.8 cm = volume: 175 mL. 7 mm
echogenic focus in the midpole, likely small angiomyolipoma. No
hydronephrosis.

Bladder:

Appears normal for degree of bladder distention.
IMPRESSION: Moderate right hydronephrosis.

Small 7 mm echogenic focus in the midpole of the left kidney, likely
small angiomyolipoma.

## 2022-08-03 DIAGNOSIS — F909 Attention-deficit hyperactivity disorder, unspecified type: Secondary | ICD-10-CM | POA: Diagnosis not present

## 2022-08-05 ENCOUNTER — Other Ambulatory Visit: Payer: Self-pay

## 2022-08-05 MED ORDER — AMPHETAMINE-DEXTROAMPHETAMINE 20 MG PO TABS: 20.0000 mg | ORAL_TABLET | Freq: Two times a day (BID) | ORAL | 0 refills | Status: AC

## 2022-10-29 DIAGNOSIS — Z0000 Encounter for general adult medical examination without abnormal findings: Secondary | ICD-10-CM | POA: Diagnosis not present

## 2022-10-29 DIAGNOSIS — E559 Vitamin D deficiency, unspecified: Secondary | ICD-10-CM | POA: Diagnosis not present

## 2022-10-29 DIAGNOSIS — E538 Deficiency of other specified B group vitamins: Secondary | ICD-10-CM | POA: Diagnosis not present

## 2022-10-29 DIAGNOSIS — F902 Attention-deficit hyperactivity disorder, combined type: Secondary | ICD-10-CM | POA: Diagnosis not present

## 2023-06-24 DIAGNOSIS — E538 Deficiency of other specified B group vitamins: Secondary | ICD-10-CM | POA: Diagnosis not present

## 2023-06-24 DIAGNOSIS — F902 Attention-deficit hyperactivity disorder, combined type: Secondary | ICD-10-CM | POA: Diagnosis not present

## 2023-06-24 DIAGNOSIS — E559 Vitamin D deficiency, unspecified: Secondary | ICD-10-CM | POA: Diagnosis not present

## 2023-12-14 DIAGNOSIS — H18821 Corneal disorder due to contact lens, right eye: Secondary | ICD-10-CM | POA: Diagnosis not present

## 2023-12-30 DIAGNOSIS — Z0000 Encounter for general adult medical examination without abnormal findings: Secondary | ICD-10-CM | POA: Diagnosis not present

## 2023-12-30 DIAGNOSIS — E538 Deficiency of other specified B group vitamins: Secondary | ICD-10-CM | POA: Diagnosis not present

## 2023-12-30 DIAGNOSIS — E559 Vitamin D deficiency, unspecified: Secondary | ICD-10-CM | POA: Diagnosis not present

## 2024-01-06 ENCOUNTER — Other Ambulatory Visit: Payer: Self-pay

## 2024-01-06 DIAGNOSIS — E559 Vitamin D deficiency, unspecified: Secondary | ICD-10-CM | POA: Diagnosis not present

## 2024-01-06 DIAGNOSIS — F5101 Primary insomnia: Secondary | ICD-10-CM | POA: Diagnosis not present

## 2024-01-06 DIAGNOSIS — Z0000 Encounter for general adult medical examination without abnormal findings: Secondary | ICD-10-CM | POA: Diagnosis not present

## 2024-01-06 DIAGNOSIS — F419 Anxiety disorder, unspecified: Secondary | ICD-10-CM | POA: Diagnosis not present

## 2024-01-06 DIAGNOSIS — N926 Irregular menstruation, unspecified: Secondary | ICD-10-CM | POA: Diagnosis not present

## 2024-01-06 DIAGNOSIS — Z1331 Encounter for screening for depression: Secondary | ICD-10-CM | POA: Diagnosis not present

## 2024-01-06 DIAGNOSIS — E538 Deficiency of other specified B group vitamins: Secondary | ICD-10-CM | POA: Diagnosis not present

## 2024-01-06 DIAGNOSIS — D72819 Decreased white blood cell count, unspecified: Secondary | ICD-10-CM | POA: Diagnosis not present

## 2024-01-06 DIAGNOSIS — F902 Attention-deficit hyperactivity disorder, combined type: Secondary | ICD-10-CM | POA: Diagnosis not present

## 2024-01-06 MED ORDER — AMPHETAMINE-DEXTROAMPHETAMINE 20 MG PO TABS: 20.0000 mg | ORAL_TABLET | Freq: Two times a day (BID) | ORAL | 0 refills | Status: DC

## 2024-01-06 MED ORDER — AMPHETAMINE-DEXTROAMPHETAMINE 20 MG PO TABS: 20.0000 mg | ORAL_TABLET | Freq: Two times a day (BID) | ORAL | 0 refills | Status: AC

## 2024-01-06 MED ORDER — AMPHETAMINE-DEXTROAMPHETAMINE 20 MG PO TABS
20.0000 mg | ORAL_TABLET | Freq: Two times a day (BID) | ORAL | 0 refills | Status: AC
  Filled 2024-01-06: qty 60, 30d supply, fill #0
